# Patient Record
Sex: Female | Born: 1960
Health system: Southern US, Community
[De-identification: ages and names within clinical notes are randomized; demographics above are authoritative.]

## PROBLEM LIST (undated history)

## (undated) DIAGNOSIS — I10 Essential (primary) hypertension: Secondary | ICD-10-CM

## (undated) DIAGNOSIS — E871 Hypo-osmolality and hyponatremia: Secondary | ICD-10-CM

## (undated) DIAGNOSIS — Z803 Family history of malignant neoplasm of breast: Secondary | ICD-10-CM

## (undated) HISTORY — DX: Hypo-osmolality and hyponatremia: E87.1

## (undated) HISTORY — DX: Essential (primary) hypertension: I10

## (undated) HISTORY — DX: Family history of malignant neoplasm of breast: Z80.3

---

## 2006-12-10 ENCOUNTER — Other Ambulatory Visit: Admission: RE | Admit: 2006-12-10 | Discharge: 2006-12-10 | Payer: Self-pay | Admitting: Obstetrics and Gynecology

## 2007-01-16 ENCOUNTER — Encounter: Admission: RE | Admit: 2007-01-16 | Discharge: 2007-01-16 | Payer: Self-pay | Admitting: Obstetrics and Gynecology

## 2007-12-19 ENCOUNTER — Other Ambulatory Visit: Admission: RE | Admit: 2007-12-19 | Discharge: 2007-12-19 | Payer: Self-pay | Admitting: Obstetrics and Gynecology

## 2008-03-08 ENCOUNTER — Encounter: Admission: RE | Admit: 2008-03-08 | Discharge: 2008-03-08 | Payer: Self-pay | Admitting: Family Medicine

## 2009-04-06 ENCOUNTER — Encounter: Admission: RE | Admit: 2009-04-06 | Discharge: 2009-04-06 | Payer: Self-pay | Admitting: Obstetrics and Gynecology

## 2009-04-13 ENCOUNTER — Encounter: Admission: RE | Admit: 2009-04-13 | Discharge: 2009-04-13 | Payer: Self-pay | Admitting: Obstetrics and Gynecology

## 2009-05-16 ENCOUNTER — Other Ambulatory Visit: Admission: RE | Admit: 2009-05-16 | Discharge: 2009-05-16 | Payer: Self-pay | Admitting: Obstetrics and Gynecology

## 2010-05-03 ENCOUNTER — Encounter: Admission: RE | Admit: 2010-05-03 | Discharge: 2010-05-03 | Payer: Self-pay | Admitting: Obstetrics and Gynecology

## 2010-11-27 ENCOUNTER — Encounter: Payer: Self-pay | Admitting: Obstetrics and Gynecology

## 2011-04-09 ENCOUNTER — Other Ambulatory Visit: Payer: Self-pay | Admitting: Obstetrics and Gynecology

## 2011-04-09 DIAGNOSIS — Z1231 Encounter for screening mammogram for malignant neoplasm of breast: Secondary | ICD-10-CM

## 2011-05-23 ENCOUNTER — Ambulatory Visit
Admission: RE | Admit: 2011-05-23 | Discharge: 2011-05-23 | Disposition: A | Discharge status: Home / Self Care | Payer: 59 | Source: Ambulatory Visit | Attending: Obstetrics and Gynecology | Admitting: Obstetrics and Gynecology

## 2011-05-23 DIAGNOSIS — Z1231 Encounter for screening mammogram for malignant neoplasm of breast: Secondary | ICD-10-CM

## 2011-05-24 ENCOUNTER — Other Ambulatory Visit: Payer: Self-pay | Admitting: Obstetrics and Gynecology

## 2011-05-24 DIAGNOSIS — R928 Other abnormal and inconclusive findings on diagnostic imaging of breast: Secondary | ICD-10-CM

## 2011-06-01 ENCOUNTER — Other Ambulatory Visit: Payer: Self-pay | Admitting: Obstetrics and Gynecology

## 2011-06-01 ENCOUNTER — Ambulatory Visit
Admission: RE | Admit: 2011-06-01 | Discharge: 2011-06-01 | Disposition: A | Discharge status: Home / Self Care | Payer: 59 | Source: Ambulatory Visit | Attending: Obstetrics and Gynecology | Admitting: Obstetrics and Gynecology

## 2011-06-01 DIAGNOSIS — R928 Other abnormal and inconclusive findings on diagnostic imaging of breast: Secondary | ICD-10-CM

## 2011-06-06 ENCOUNTER — Ambulatory Visit
Admission: RE | Admit: 2011-06-06 | Discharge: 2011-06-06 | Disposition: A | Discharge status: Home / Self Care | Payer: 59 | Source: Ambulatory Visit | Attending: Obstetrics and Gynecology | Admitting: Obstetrics and Gynecology

## 2011-06-06 DIAGNOSIS — R928 Other abnormal and inconclusive findings on diagnostic imaging of breast: Secondary | ICD-10-CM

## 2011-06-18 ENCOUNTER — Ambulatory Visit (INDEPENDENT_AMBULATORY_CARE_PROVIDER_SITE_OTHER): Payer: 59 | Admitting: General Surgery

## 2011-06-18 ENCOUNTER — Encounter (INDEPENDENT_AMBULATORY_CARE_PROVIDER_SITE_OTHER): Payer: Self-pay | Admitting: General Surgery

## 2011-06-18 ENCOUNTER — Other Ambulatory Visit (INDEPENDENT_AMBULATORY_CARE_PROVIDER_SITE_OTHER): Payer: Self-pay | Admitting: General Surgery

## 2011-06-18 VITALS — BP 160/96 | HR 64 | Temp 98.8°F | Ht 60.0 in | Wt 143.0 lb

## 2011-06-18 DIAGNOSIS — R921 Mammographic calcification found on diagnostic imaging of breast: Secondary | ICD-10-CM

## 2011-06-18 DIAGNOSIS — R920 Mammographic microcalcification found on diagnostic imaging of breast: Secondary | ICD-10-CM

## 2011-06-18 NOTE — Progress Notes (Signed)
Subjective:     Patient ID: [name], female   DOB: [birth year], 50 y.o.   MRN: [id number]  HPI This is a very pleasant 50 year old Caucasian female, sent to me through the courtesy of Dr. Anselmo Pickler had a resident drains were. She is here for consideration of excision of an area of microcalcifications in the upper-outer quadrant of the left breast which could not be done stereotactically. Her primary care physician is Gerald Leitz. She also was seen by [facility] cardiology because of hypertension.  The patient has never had a breast problem in the past. She simply went for screening mammograms. They found a focal area of microcalcifications  high in the upper outer quadrant of the left breast, very superior and very posterior. They attempted stereotactic biopsy but could not image this well for that. She was referred for consideration of open biopsy.  Family history is strongly positive for breast cancer in the mother who is deceased, and a sister who is still alive. It is not known for sure whether any genetic testing has been done.  Past Medical History  Diagnosis Date  . Hypertension   . Family history of breast cancer     mother, sister   Current Outpatient Prescriptions  Medication Sig Dispense Refill  . hydrochlorothiazide 25 MG tablet Take 25 mg by mouth daily.        . ramipril (ALTACE) 10 MG tablet Take 10 mg by mouth daily.         Not on File  Family History  Problem Relation Age of Onset  . Cancer Mother     breast  . Heart disease Father   . Cancer Sister     breast   History  Substance Use Topics  . Smoking status: Current Everyday Smoker -- 0.3 packs/day    Types: Cigarettes  . Smokeless tobacco: Not on file  . Alcohol Use: Yes     Weekly     Review of Systems  Constitutional: Negative.   HENT: Negative.   Eyes: Negative.   Respiratory: Negative.   Cardiovascular: Negative.   Gastrointestinal: Negative.   Genitourinary: Negative.   Musculoskeletal:  Negative.   Neurological: Negative.   Hematological: Negative.   Psychiatric/Behavioral: Negative.        Objective:   Physical Exam  Constitutional: She is oriented to person, place, and time. She appears well-developed and well-nourished. No distress.  HENT:  Head: Normocephalic and atraumatic.  Mouth/Throat: No oropharyngeal exudate.  Eyes: Conjunctivae and EOM are normal. Pupils are equal, round, and reactive to light. No scleral icterus.  Neck: Neck supple. No JVD present. No tracheal deviation present. No thyromegaly present.  Cardiovascular: Normal rate and normal heart sounds.   No murmur heard.      occas. Extrasystole.  Pulmonary/Chest: Effort normal and breath sounds normal. No respiratory distress. She has no wheezes. She has no rales. She exhibits no tenderness.    Abdominal: Soft. Bowel sounds are normal. She exhibits no distension and no mass. There is no tenderness. There is no rebound and no guarding.  Musculoskeletal: Normal range of motion. She exhibits no edema and no tenderness.  Lymphadenopathy:    She has no cervical adenopathy.  Neurological: She is alert and oriented to person, place, and time. She exhibits normal muscle tone.  Skin: Skin is warm and dry. No rash noted. No erythema. No pallor.  Psychiatric: She has a normal mood and affect. Her behavior is normal. Judgment and thought content normal.  Assessment:     Focal microcalcifications, left breast, upper outer quadrant, posterior.  Strong family history of breast cancer in the mother and sister.  Hypertension.    Plan:     Scheduled for excision left breast microcalcifications with needle localization in the near future.  I have discussed the indications and details of surgery with her. Risks and complications have been outlined, including but not limited to bleeding, infection, reoperation for positive margins of cancer, reoperation for complications, cosmetic deformity, cardiac  pulmonary and thromboembolic problems. She seems to understand these issues well. This time all of her questions were answered. She is infiltrated with this plan.  I told her that consideration could be given to referring her to a high risk clinic at some point in the future.

## 2011-06-18 NOTE — Patient Instructions (Signed)
You have a focal area of microcalcifications in the very posterior aspect of the upper outer quadrant of your left breast. You also have a strong family history of breast cancer in her mother and sister. These findings are high risk findings, although this may not be cancer. I cannot feel anything abnormal in your breast. We plan to schedule you for a left breast biopsy with needle localization in the near future.

## 2011-06-28 ENCOUNTER — Encounter (HOSPITAL_BASED_OUTPATIENT_CLINIC_OR_DEPARTMENT_OTHER)
Admission: RE | Admit: 2011-06-28 | Discharge: 2011-06-28 | Disposition: A | Discharge status: Home / Self Care | Payer: 59 | Source: Ambulatory Visit | Attending: General Surgery | Admitting: General Surgery

## 2011-06-28 LAB — BASIC METABOLIC PANEL
CO2: 29 mEq/L (ref 19–32)
Calcium: 10.5 mg/dL (ref 8.4–10.5)
Creatinine, Ser: 0.61 mg/dL (ref 0.50–1.10)
GFR calc Af Amer: >60 mL/min (ref >60)
GFR calc non Af Amer: >60 mL/min (ref >60)
Potassium: 3.9 mEq/L (ref 3.5–5.1)
Sodium: 133 mEq/L — ABNORMAL LOW (ref 135–145)

## 2011-06-28 LAB — DIFFERENTIAL
Lymphocytes Relative: 27 % (ref 12–46)
Monocytes Absolute: 0.9 10*3/uL (ref 0.1–1.0)
Monocytes Relative: 9 % (ref 3–12)
Neutrophils Relative %: 61 % (ref 43–77)

## 2011-06-28 LAB — URINALYSIS, ROUTINE W REFLEX MICROSCOPIC
Hgb urine dipstick: NEGATIVE
Ketones, ur: 15 mg/dL — AB
Nitrite: NEGATIVE
pH: 7 (ref 5.0–8.0)

## 2011-06-28 LAB — CBC
MCV: 88.4 fL (ref 78.0–100.0)
RBC: 5.19 MIL/uL — ABNORMAL HIGH (ref 3.87–5.11)
RDW: 13.2 % (ref 11.5–15.5)

## 2011-06-28 LAB — URINE MICROSCOPIC-ADD ON

## 2011-06-29 ENCOUNTER — Ambulatory Visit
Admission: RE | Admit: 2011-06-29 | Discharge: 2011-06-29 | Disposition: A | Discharge status: Home / Self Care | Payer: 59 | Source: Ambulatory Visit | Attending: General Surgery | Admitting: General Surgery

## 2011-06-29 ENCOUNTER — Other Ambulatory Visit (INDEPENDENT_AMBULATORY_CARE_PROVIDER_SITE_OTHER): Payer: Self-pay | Admitting: General Surgery

## 2011-06-29 ENCOUNTER — Ambulatory Visit (HOSPITAL_BASED_OUTPATIENT_CLINIC_OR_DEPARTMENT_OTHER)
Admission: RE | Admit: 2011-06-29 | Discharge: 2011-06-29 | Disposition: A | Discharge status: Home / Self Care | Payer: 59 | Source: Ambulatory Visit | Attending: General Surgery | Admitting: General Surgery

## 2011-06-29 DIAGNOSIS — Z01810 Encounter for preprocedural cardiovascular examination: Secondary | ICD-10-CM | POA: Insufficient documentation

## 2011-06-29 DIAGNOSIS — R921 Mammographic calcification found on diagnostic imaging of breast: Secondary | ICD-10-CM

## 2011-06-29 DIAGNOSIS — Z803 Family history of malignant neoplasm of breast: Secondary | ICD-10-CM | POA: Insufficient documentation

## 2011-06-29 DIAGNOSIS — R920 Mammographic microcalcification found on diagnostic imaging of breast: Secondary | ICD-10-CM

## 2011-06-29 DIAGNOSIS — F17200 Nicotine dependence, unspecified, uncomplicated: Secondary | ICD-10-CM | POA: Insufficient documentation

## 2011-06-29 DIAGNOSIS — Z01812 Encounter for preprocedural laboratory examination: Secondary | ICD-10-CM | POA: Insufficient documentation

## 2011-06-29 DIAGNOSIS — I10 Essential (primary) hypertension: Secondary | ICD-10-CM | POA: Insufficient documentation

## 2011-06-29 DIAGNOSIS — Z01818 Encounter for other preprocedural examination: Secondary | ICD-10-CM | POA: Insufficient documentation

## 2011-06-29 HISTORY — PX: BREAST BIOPSY: SHX20

## 2011-07-02 NOTE — Op Note (Signed)
NAMESTEPHENE, [name] NO.:  [id number]  MEDICAL RECORD NO.:  [id number]  LOCATION:                                 FACILITY:  PHYSICIAN:  Angelia Mould. Derrell Lolling, M.D.     DATE OF BIRTH:  DATE OF PROCEDURE:  06/29/2011 DATE OF DISCHARGE:                              OPERATIVE REPORT   PREOPERATIVE DIAGNOSIS:  Microcalcifications, left breast, family history of breast cancer.  POSTOPERATIVE DIAGNOSIS:  Microcalcifications, left breast, family history of breast cancer.  OPERATION PERFORMED:  Left partial mastectomy with needle localization.  SURGEON:  Angelia Mould. Derrell Lolling, MD  OPERATIVE INDICATIONS:  This is a 50 year old Caucasian female who recently had screening mammograms and there was a very focal area of microcalcifications, very high and very posterior in the upper outer quadrant of the left breast.  Stereotactic biopsy was not technically feasible and the patient was referred for consideration of an open biopsy.  Physical exam is unremarkable.  Family history is strongly positive for breast cancer in the mother and in a sister.  She is brought to the operating room electively.  OPERATIVE TECHNIQUE:  The patient underwent wire localization at [facility] prior to the surgery.  The wire entered from the very high upper outer quadrant of the breast and was directed inferomedially and posteriorly.  The wire did go very close to the microcalcifications and passed it for about 2.5 cm.  The patient was then brought to [facility].  The patient was identified and the films were reviewed.  She was taken to operating room.  General anesthesia was induced.  The left breast was prepped and draped in a sterile fashion.  The patient was identified as correct patient, correct procedure, and correct site.  Intravenous antibiotics were given.  0.5% Marcaine with epinephrine was used a local infiltration anesthetic.  A curvilinear  incision, which paralleled the areolar margin was placed very high in the upper outer quadrant just inside the wire insertion site.  Dissection was carried down into the breast tissue around the wire.  The dissection was carried all the way down to the pectoralis fascia and down and around at the lateral border of the pectoralis major muscle and almost entering the axilla.  I took a long cylindrical piece of breast tissue out and got the entire specimen and wire out in one-piece.  Specimen mammogram showed the calcifications to be in the specimen adjacent to the wire.  Dr. Roswell Nickel reviewed this. The specimen was marked with a six-color margin marker kit.  The specimen was sent to Pathology.  The wound was irrigated with saline. Hemostasis was excellent and achieved with electrocautery.  The deeper breast tissues were closed with interrupted sutures of 3-0 Vicryl and the skin closed with running subcuticular suture of 4-0 Monocryl and Dermabond.  The patient was taken to the recovery room in good condition.  Estimated blood loss was about 10 mL.  Complications were none.  Sponge, needle, and instrument counts were correct.     Angelia Mould. Derrell Lolling, M.D.     HMI/MEDQ  D:  06/29/2011  T:  06/30/2011  Job:  [id number]  cc:  Anselmo Pickler, MD Gerald Leitz, MD Corky Crafts, MD  Electronically Signed by Claud Kelp M.D. on 07/02/2011 07:02:03 PM

## 2011-07-03 ENCOUNTER — Telehealth (INDEPENDENT_AMBULATORY_CARE_PROVIDER_SITE_OTHER): Payer: Self-pay

## 2011-07-03 NOTE — Telephone Encounter (Signed)
Per Dr Jacinto Halim request pt advised of path result. No cancer. No further surgery. Pt at slight higher risk for br ca in future and Dr Derrell Lolling will discuss this with pt at 9-13 ov.

## 2011-07-16 ENCOUNTER — Encounter (INDEPENDENT_AMBULATORY_CARE_PROVIDER_SITE_OTHER): Payer: Self-pay | Admitting: General Surgery

## 2011-07-16 ENCOUNTER — Ambulatory Visit (INDEPENDENT_AMBULATORY_CARE_PROVIDER_SITE_OTHER): Payer: 59 | Admitting: General Surgery

## 2011-07-16 VITALS — BP 148/88 | HR 76

## 2011-07-16 DIAGNOSIS — N6099 Unspecified benign mammary dysplasia of unspecified breast: Secondary | ICD-10-CM

## 2011-07-16 DIAGNOSIS — N6089 Other benign mammary dysplasias of unspecified breast: Secondary | ICD-10-CM

## 2011-07-16 NOTE — Patient Instructions (Signed)
You are healing uneventfully from your left breast biopsy. The final pathology report shows atypical ductal hyperplasia. Because of this pathology, and because you have two  first relatives with breast cancer, you are at  increased risk for developing breast cancer in the future. No further surgery is to be done at this time. I advise annual mammography and annual breast exam in the future. You will be referred to Dr. Drue Second at the high risk risk clinic for assessment and possible genetic testing. Return to see me if any further problems arise.

## 2011-07-16 NOTE — Progress Notes (Signed)
Subjective:     Patient ID: [name], female   DOB: [birth year], 50 y.o.   MRN: [id number]  HPI  This patient underwent left breast biopsy with needle localization recently. The final pathology report shows atypical ductal hyperplasia. She has no problems with healing to date. I have discussed her pathology report with her, and I have given her a copy of the report.  We also reviewed the fact that both her mother and sister had breast cancer. The patient was advised that this places her at increased risk for breast cancer in the future. I discussed the role of referral to a high risk clinic, and she is in favor of that. She is aware that they may offer her genetic testing. Review of Systems     Objective:   Physical Exam The patient is in good spirits and looks fine.  Left breast incision, upper outer quadrant, is healing nicely. There is no sign of infection or hematoma. There is no drainage.    Assessment:     Atypical ductal hyperplasia, left breast, upper outer quadrant, status post excision.  Positive family history for breast cancer in mother and sister.    Plan:        The patient is advised of her high risk status.  She is advised to get annual mammography and annual breast exam.  She'll be referred to Dr. Drue Second at the [facility]  breast high risk clinic.  Return to see me p.r.n.

## 2011-09-20 ENCOUNTER — Encounter: Payer: 59 | Admitting: Oncology

## 2011-09-20 ENCOUNTER — Ambulatory Visit: Payer: 59

## 2011-11-10 ENCOUNTER — Telehealth: Payer: Self-pay | Admitting: Family

## 2011-11-10 NOTE — Telephone Encounter (Signed)
called pt lmovm to rtn call to schedule

## 2011-11-15 ENCOUNTER — Telehealth: Payer: Self-pay | Admitting: Oncology

## 2011-11-15 ENCOUNTER — Telehealth: Payer: Self-pay | Admitting: Family

## 2011-11-15 NOTE — Telephone Encounter (Signed)
called pts lmov m to rtn call to scheduled appt 

## 2011-12-13 ENCOUNTER — Telehealth: Payer: Self-pay | Admitting: *Deleted

## 2011-12-13 NOTE — Telephone Encounter (Signed)
Confirmed 12/26/11 high risk appt w/ pt.

## 2011-12-20 ENCOUNTER — Other Ambulatory Visit: Payer: Self-pay | Admitting: Physician Assistant

## 2011-12-26 ENCOUNTER — Ambulatory Visit (HOSPITAL_BASED_OUTPATIENT_CLINIC_OR_DEPARTMENT_OTHER): Payer: 59 | Admitting: Family

## 2011-12-26 ENCOUNTER — Telehealth: Payer: Self-pay | Admitting: Oncology

## 2011-12-26 ENCOUNTER — Encounter: Payer: Self-pay | Admitting: Family

## 2011-12-26 DIAGNOSIS — N6099 Unspecified benign mammary dysplasia of unspecified breast: Secondary | ICD-10-CM | POA: Insufficient documentation

## 2011-12-26 DIAGNOSIS — I10 Essential (primary) hypertension: Secondary | ICD-10-CM

## 2011-12-26 DIAGNOSIS — N6089 Other benign mammary dysplasias of unspecified breast: Secondary | ICD-10-CM

## 2011-12-26 DIAGNOSIS — Z803 Family history of malignant neoplasm of breast: Secondary | ICD-10-CM

## 2011-12-26 NOTE — Progress Notes (Signed)
[facility]  High Risk Clinic New Patient Evaluation  Name: [name]            Date: 12/26/2011 MRN: [id number]                DOB: 10-18-1961  CC: Jessee Avers., MD, MD     REFERRING PHYSICIAN: Jessee Avers., MD   REASON FOR VISIT: Recent diagnosis of atypical ductal hyperplasia, left breast.   HISTORY OF PRESENT ILLNESS: Had screening mammogram July 2012 which showed suspicious lesion, left breast. Had excisional biopsy by Dr. Derrell Lolling August 2012, showed atypical ductal hyperplasia with radial scar.   PREVIOUS BREAST/OVARIAN BIOPSIES: One, September 2012. (See above).   PAST MEDICAL HISTORY:  Hypertension, otherwise healthy. No prior thoracic radiation.   PAST SURGICAL HISTORY:  Past Surgical History  Procedure Date  . Breast biopsy 06/29/11    left      CURRENT MEDICATIONS: Altace and Norvasc daily.  ALLERGIES: No known allergies.  SOCIAL HISTORY: Former smoker, drinks wine, 1 glass nightly.   HEALTH HABITS: Vitamins: No Supplements:No Alternative Therapies: No Adverse environmental exposure: No Servings of fruit and vegetables/day: 4 Servings of meat/day: 1 Exercises regularly:  45  Min/wk:  REPRODUCTIVE HISTORY:  Menarche age: 51 Gravida:   3    Para:  3 First Live Birth:  Age 51 Took fertility meds:   no  Menses: 12 Menopause: natural/surgical  Age 51 HRT Y/N Currently Y/N   FAMILY HISTORY: Breast cancer, mother in her 3's. Was treated with chemotherapy and died of Leukemia 10 years later. Sister with breast cancer in her 75's, still living. Sister was tested for BRCA1/2 in 2003: negative. Maternal aunt with ovarian cancer. No female breast cancer.    HEALTH MAINTENANCE: Last mammogram: July 2012 Last clinical breast exam: August 2012 Performs self breast exam: yes Last Pap Smear: Not sure.  BMI: 28.28  REVIEW OF SYSTEMS:  General: Negative for fever, chills, night sweats,  loss of appetite or weight loss. HEENT:  Negative for headaches, sore  throat, difficulty swallowing, blurred vision or problem with hearing or  sinus congestion. Respiratory: Negative for shortness of breath, cough  or dyspnea on exertion. Cardiovascular: Negative for chest pain,  palpitations or pedal edema. GI: Negative for nausea, vomiting,  diarrhea, constipation, change in bowel habits or blood in the stool.  No jaundice. GU: Negative for painful or frequent urination, change in  color of urine, or decreased urinary stream. Integumentary: Negative  for skin rashes or other suspicious skin lesions. Hematologic: Negative  for easy bruisability or bleeding. Musculoskeletal: Negative for  complaints of pain, arthralgias, arthritis or myalgias.  Neurological/psychiatric: Negative for numbness, focal weakness,  balance problems or coordination difficulties. No depression or mood swings.  Breast: No self detected abnormalities in the breast. No nipple discharge, masses or redness of the skin.   PHYSICAL EXAM: BP 152/90  Pulse 84  Temp(Src) 98.4 F (36.9 C) (Oral)  Ht 5' (1.524 m)  Wt 144 lb 12.8 oz (65.681 kg)  BMI 28.28 kg/m2 GENERAL: Well developed, well nourished, in no acute distress.  EENT: No ocular or oral lesions. No stomatitis.  RESPIRATORY: Lungs are clear to auscultation bilaterally with normal respiratory movement and no accessory muscle use. CARDIAC: No murmur, rub or tachycardia. No upper or lower extremity edema.  GI: Abdomen is soft, no palpable hepatosplenomegaly. No fluid wave. No tenderness. Musculoskeletal: No kyphosis, no tenderness over the spine, ribs or hips. Lymph: No cervical, infraclavicular, axillary or  inguinal adenopathy. Neuro: No focal neurological deficits. Psych: Alert and oriented X 3, appropriate mood and affect.   ASSESSMENT: 51 year old with: 1. Strong family history of breast cancer 2. Biopsy showing atypical ductal hyperplasia, left breast. 3. GAIL model 6.5% 5 year risk, 42.4%  Lifetime risk of breast cancer.    PLAN:  1. Referral to [facility] for genetic testing.  2. Information is given on Tamoxifen and Raloxifene. She will return in 1 month, we should have results of genetic testing at that visit and recommendations will be made based on those results.   A discussion is held about Tamoxifen/Raloxifene and the benefits vs risks of both. Current NCCN guidelines for high risk patients are reviewed. The discussion is tabled until such time we have genetic testing results, as a BRCA mutation would drastically change our medical management.   A total of 50 minutes was spent with the patient in counseling.

## 2011-12-26 NOTE — Telephone Encounter (Signed)
gve the pt her march 2013 appt. Pt is aware she will be contacted with the appt for the genetics. gve information to [employer]

## 2012-01-01 ENCOUNTER — Telehealth: Payer: Self-pay | Admitting: *Deleted

## 2012-01-01 NOTE — Telephone Encounter (Signed)
Confirmed 01/07/12 genetics appt w/ pt.

## 2012-01-07 ENCOUNTER — Ambulatory Visit (HOSPITAL_BASED_OUTPATIENT_CLINIC_OR_DEPARTMENT_OTHER): Payer: 59 | Admitting: Genetic Counselor

## 2012-01-07 ENCOUNTER — Ambulatory Visit: Payer: 59 | Admitting: Lab

## 2012-01-07 DIAGNOSIS — N6089 Other benign mammary dysplasias of unspecified breast: Secondary | ICD-10-CM

## 2012-01-07 DIAGNOSIS — Z803 Family history of malignant neoplasm of breast: Secondary | ICD-10-CM

## 2012-01-07 DIAGNOSIS — Z8041 Family history of malignant neoplasm of ovary: Secondary | ICD-10-CM

## 2012-01-07 DIAGNOSIS — N6099 Unspecified benign mammary dysplasia of unspecified breast: Secondary | ICD-10-CM

## 2012-01-07 NOTE — Progress Notes (Signed)
Dr.  Welton Flakes requested a consultation for genetic counseling and risk assessment for [name], a 51 y.o. female, for discussion of her personal history of atypical hyperplasia and family history of breast and ovarian cancer. She presents to clinic today to discuss the possibility of a genetic predisposition to cancer, and to further clarify her risks, as well as her family members' risks for cancer.   HISTORY OF PRESENT ILLNESS: [name] is a 51 y.o. female with no personal history of cancer.    Past Medical History  Diagnosis Date  . Hypertension   . Family history of breast cancer     mother, sister    Past Surgical History  Procedure Date  . Breast biopsy 06/29/11    left, atypical ductal hyperplasia with radial scar    History  Substance Use Topics  . Smoking status: Current Some Day Smoker    Types: Cigarettes  . Smokeless tobacco: Not on file  . Alcohol Use: 3.0 oz/week    5 Glasses of wine per week     Weekly    REPRODUCTIVE HISTORY AND PERSONAL RISK ASSESSMENT FACTORS: Menarche was at age 89.   Postmenopausal: 1-2 years Uterus Intact: Yes Ovaries Intact: Yes G3P3A0 , first live birth at age 52  She has not previously undergone treatment for infertility.    FAMILY HISTORY:  We obtained a detailed, 4-generation family history.  Significant diagnoses are listed below: Family History  Problem Relation Age of Onset  . Cancer Mother     breast  . Heart disease Father   . Cancer Sister     breast  . Cancer Maternal Aunt   Ms. Loree has a personal history of atypical hyperplasia.  She has two sisters, one whom was diagnosed with breast cancer at age 78.  This sister underwent BRCA testing in 2003 and was negative.l  Ms. Hainline's mother was diagnosed with breast cancer in her 75s and died of leukemia in her 43s.  She has a maternal aunt with ovarian cancer diagnosed over the age of 59.  There are several other cancers in her mother's siblings, but Ms.  Mezera could not be specific.  She will find out more information and call me.  Her father died of a heart attack at age 47.  He had 3 siblings, however no information is known about their health status.  His parents died when he was very young and no information is known on them.  Patient's ancestors are of Irish/German descent. There is no reported Ashkenazi Jewish ancestry. There is no  known consanguinity.  GENETIC COUNSELING RISK ASSESSMENT, DISCUSSION, AND SUGGESTED FOLLOW UP: We reviewed the natural history and genetic etiology of sporadic, familial and hereditary cancer syndromes.  Based on the personal and family history of Ms. Schake, it is most suggestive of hereditary breast and ovarian cancer due to mutations in the BRCA1 and BRCA2 gene.  Even though her sister was negative in 2003, testing has changed and therefore it is suggested that she be tested.  We discussed that depending on the type of cancers in her mother's other siblings, we could consider either a Breast Next panel (panel of 14 genes implicated in breast cancer) or a cancer next panel (22 genes implicated in several cancer syndromes).  The patient's family history is suggestive of the following possible diagnosis: hereditary breast and ovarian cancer (BRCA1 and BRCA2)  We discussed that identification of a hereditary cancer syndrome may help her care providers tailor the patients  medical management. If a mutation indicating a BRCA1 or BRCA2 mutation is detected in this case, the [employer] recommendations would include increased screening for breast cancer and consideration of removal of her ovaries. If a mutation is detected, the patient will be referred back to the referring provider and to any additional appropriate care providers to discuss the relevant options.   If a mutation is not found in the patient, this will decrease the likelihood of BRCA1 and BRCA2 as the explanation for her family  history.  At that point we could consider pursuing a panel test through [facility].. Cancer surveillance options would be discussed for the patient according to the appropriate standard National Comprehensive Cancer Network and American Cancer Society guidelines, with consideration of their personal and family history risk factors. In this case, the patient will be referred back to their care providers for discussions of management.   After considering the risks, benefits, and limitations, the patient provided informed consent for  the following  testing:  BRACanalysis through [facility].   Per the patient's request, we will contact her by telephone to discuss these results. A follow up genetic counseling visit will be scheduled if indicated.  The patient was seen for a total of 40 minutes, greater than 50% of which was spent face-to-face counseling.  This plan is being carried out per Dr. Welton Flakes recommendations.  This note will also be sent to the referring provider via the electronic medical record. The patient will be supplied with a summary of this genetic counseling discussion as well as educational information on the discussed hereditary cancer syndromes following the conclusion of their visit.   Patient was discussed with Dr. Drue Second.   EDUCATIONAL INFORMATION SUPPLIED TO PATIENT AT ENCOUNTER:  Hereditary Breast and Ovarian Cancer by [facility]     _______________________________________________________________________ For Office Staff:  Number of people involved in session: 2 Was an Intern/ student involved with case: not applicable }

## 2012-01-15 ENCOUNTER — Encounter: Payer: Self-pay | Admitting: Genetic Counselor

## 2012-01-15 ENCOUNTER — Telehealth: Payer: Self-pay | Admitting: Genetic Counselor

## 2012-01-15 NOTE — Telephone Encounter (Signed)
Revealed negative BRCA and BART testing.  Discussed considering one of the panel tests through [facility].  She will think about it.

## 2012-01-22 ENCOUNTER — Telehealth: Payer: Self-pay | Admitting: *Deleted

## 2012-01-22 NOTE — Telephone Encounter (Signed)
patient called in requesting the cancel her appointment for 01-23-2012 but she will call back when she wants to reschedule

## 2012-01-23 ENCOUNTER — Ambulatory Visit: Payer: 59 | Admitting: Family

## 2012-02-14 ENCOUNTER — Telehealth: Payer: Self-pay | Admitting: *Deleted

## 2012-02-14 NOTE — Telephone Encounter (Signed)
Patient called me back and does not want to come in right now for the high risk clinic because it is too costly with her insurance and she has a lot going on.   She will continue to do her research and call us back in maybe 6 months or so.

## 2012-03-25 ENCOUNTER — Other Ambulatory Visit (HOSPITAL_COMMUNITY): Payer: Self-pay | Admitting: Otolaryngology

## 2012-03-27 ENCOUNTER — Other Ambulatory Visit (HOSPITAL_COMMUNITY): Payer: 59

## 2012-03-27 ENCOUNTER — Ambulatory Visit (HOSPITAL_COMMUNITY): Admission: RE | Admit: 2012-03-27 | Payer: 59 | Source: Ambulatory Visit

## 2012-04-01 ENCOUNTER — Other Ambulatory Visit (HOSPITAL_COMMUNITY): Payer: 59

## 2012-04-01 ENCOUNTER — Ambulatory Visit (HOSPITAL_COMMUNITY): Payer: 59

## 2012-04-04 ENCOUNTER — Ambulatory Visit (HOSPITAL_COMMUNITY)
Admission: RE | Admit: 2012-04-04 | Discharge: 2012-04-04 | Disposition: A | Discharge status: Home / Self Care | Payer: 59 | Source: Ambulatory Visit | Attending: Otolaryngology | Admitting: Otolaryngology

## 2012-04-04 ENCOUNTER — Ambulatory Visit (HOSPITAL_COMMUNITY): Admission: RE | Admit: 2012-04-04 | Payer: 59 | Source: Ambulatory Visit

## 2012-04-04 DIAGNOSIS — R1310 Dysphagia, unspecified: Secondary | ICD-10-CM | POA: Insufficient documentation

## 2012-04-04 NOTE — Progress Notes (Addendum)
Pt arrived for MBS to be followed by Barium Swallow. Pt complained of choking exclusively with solid foods with no history of pna. Pt expressed desire to limit cost of testing so SLP suggested doing Barium swallow first to determine if esophageal dysphagia was the primary cause of choking given complaints most consistent with this diagnosis. SLP observed Barium Swallow with Dr. Amil Amen who diagnosed pt with Schatzki's ring as likely source of complaint. SLP deferred MBS. If further testing is desired, please reorder. Thank you, Harlon Ditty, MA CCC-SLP [phone]

## 2012-04-07 ENCOUNTER — Encounter: Payer: Self-pay | Admitting: Internal Medicine

## 2012-04-30 ENCOUNTER — Ambulatory Visit: Payer: 59 | Admitting: Internal Medicine

## 2012-06-25 ENCOUNTER — Other Ambulatory Visit (HOSPITAL_COMMUNITY)
Admission: RE | Admit: 2012-06-25 | Discharge: 2012-06-25 | Disposition: A | Discharge status: Home / Self Care | Payer: 59 | Source: Ambulatory Visit | Attending: Obstetrics and Gynecology | Admitting: Obstetrics and Gynecology

## 2012-06-25 ENCOUNTER — Other Ambulatory Visit: Payer: Self-pay | Admitting: Obstetrics and Gynecology

## 2012-06-25 DIAGNOSIS — Z01419 Encounter for gynecological examination (general) (routine) without abnormal findings: Secondary | ICD-10-CM | POA: Insufficient documentation

## 2013-03-25 IMAGING — RF DG ESOPHAGUS
13 of 20 series · 14 of 24 positions shown · non-contrast
Comparison: None.

CLINICAL DATA: Dysphagia.  Vague feeling of food sticking in the
throat

ESOPHOGRAM/BARIUM SWALLOW
TECHNIQUE: Combined double contrast and single contrast
examination performed using effervescent crystals, thick barium
liquid, and thin barium liquid.
Fluoroscopy time:  2.3 minutes.

[Series 1: run · 1 of 8 slices shown (1 of 13)]
[im 1/8]
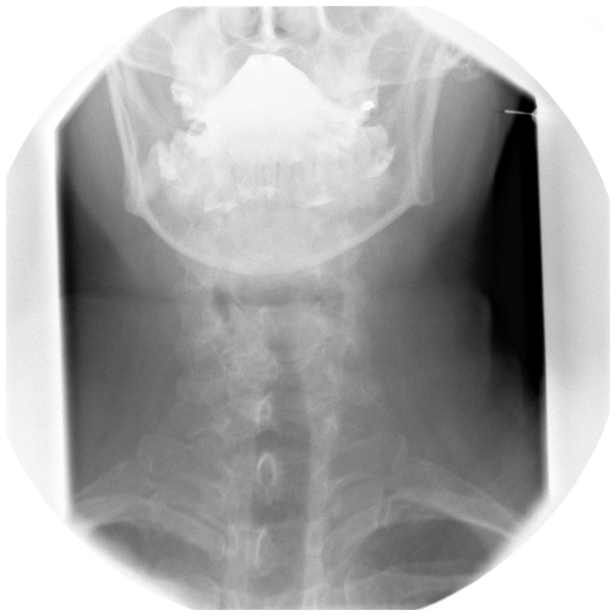

[Series 2: run · 2 of 10 slices shown (2 of 13)]
[im 1/10]
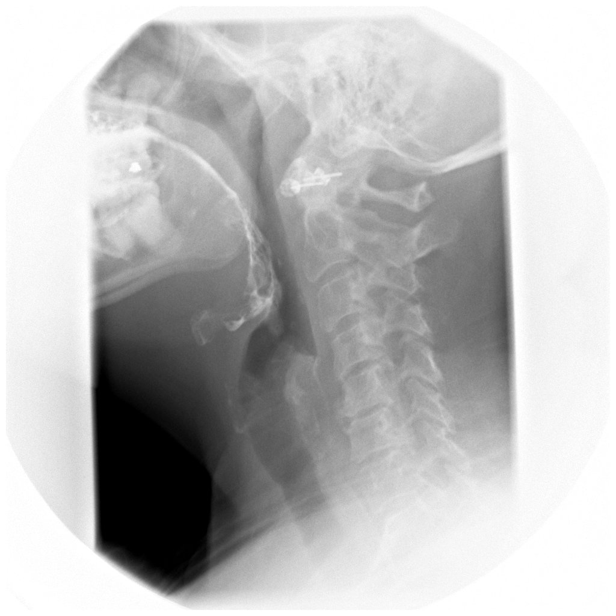
[im 7/10]
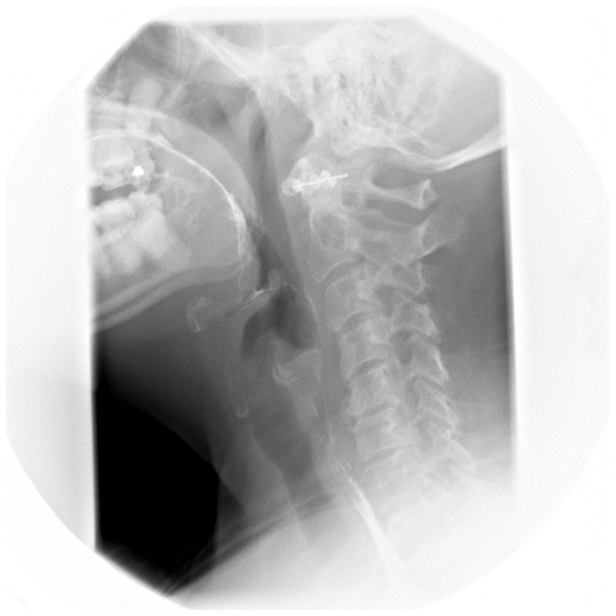

[Series 3: run · 1 of 1 slices shown (3 of 13)]
[im 1/1]
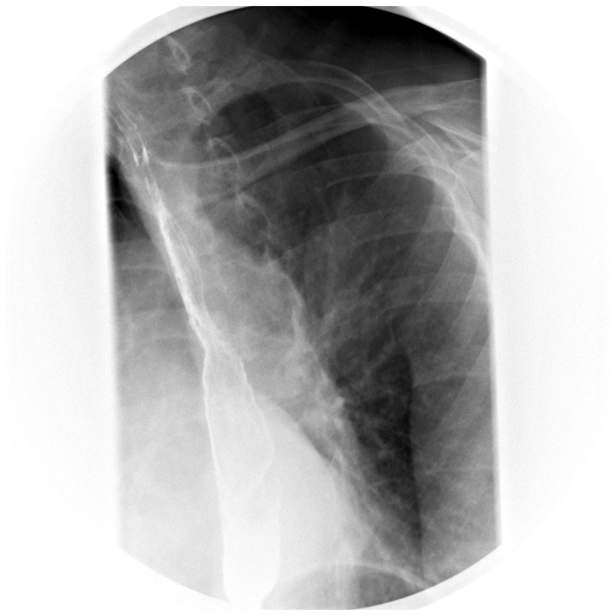

[Series 4: run · 1 of 1 slices shown (4 of 13)]
[im 1/1]
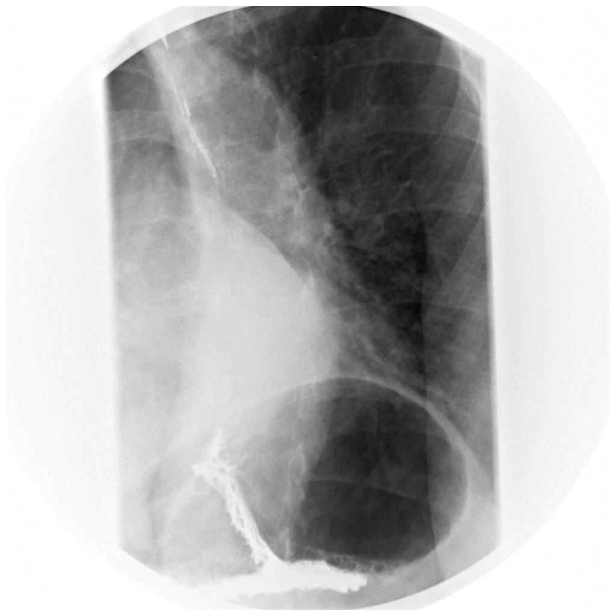

[Series 6: run · 1 of 1 slices shown (5 of 13)]
[im 1/1]
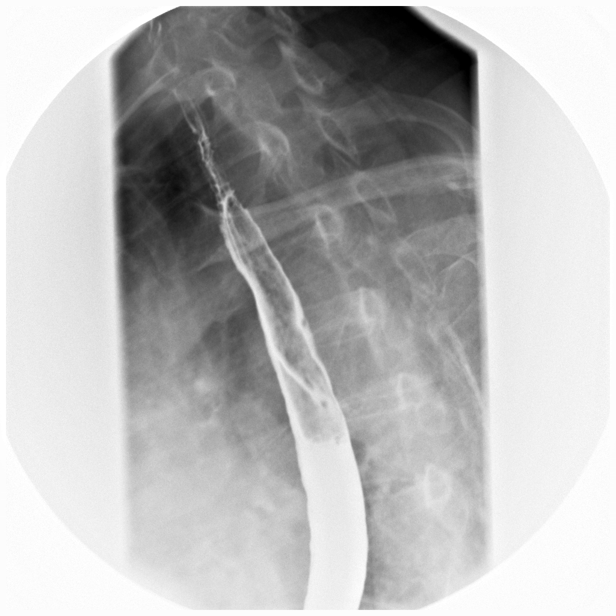

[Series 8: run · 1 of 1 slices shown (6 of 13)]
[im 1/1]
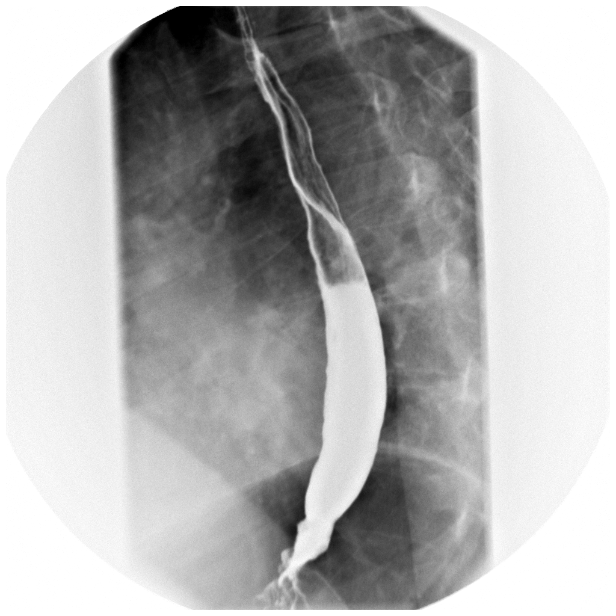

[Series 9: run · 1 of 1 slices shown (7 of 13)]
[im 1/1]
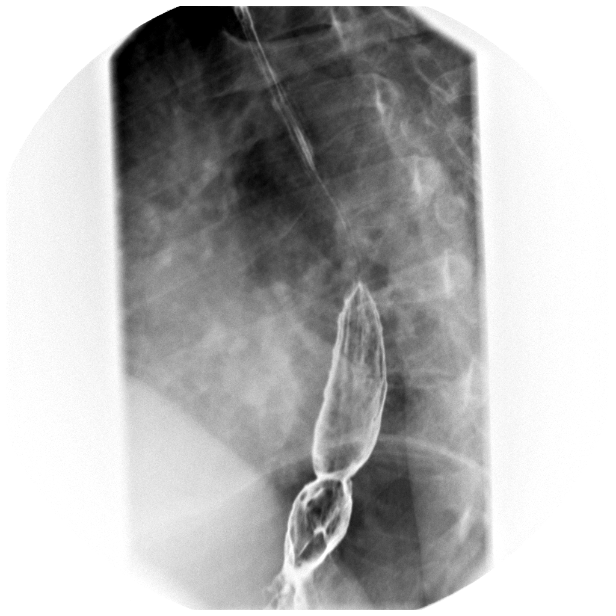

[Series 11: run · 1 of 1 slices shown (8 of 13)]
[im 1/1]
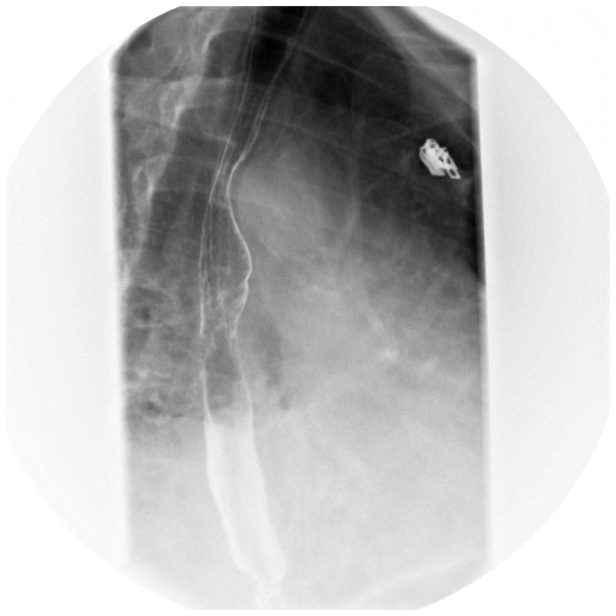

[Series 13: run · 1 of 1 slices shown (9 of 13)]
[im 1/1]
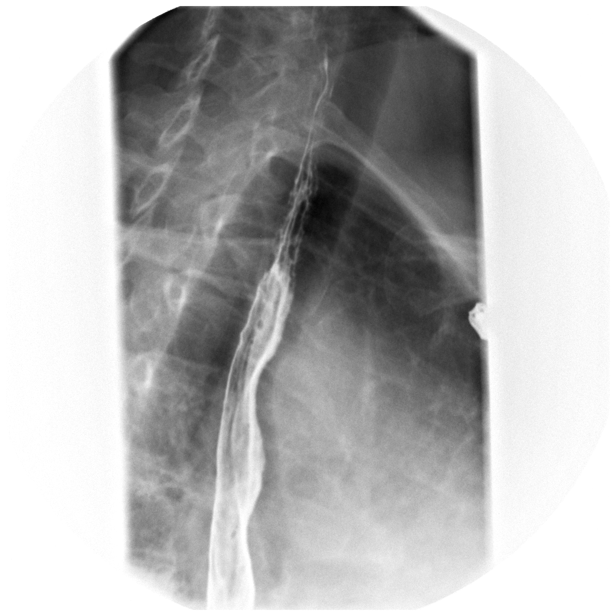

[Series 15: run · 1 of 1 slices shown (10 of 13)]
[im 1/1]
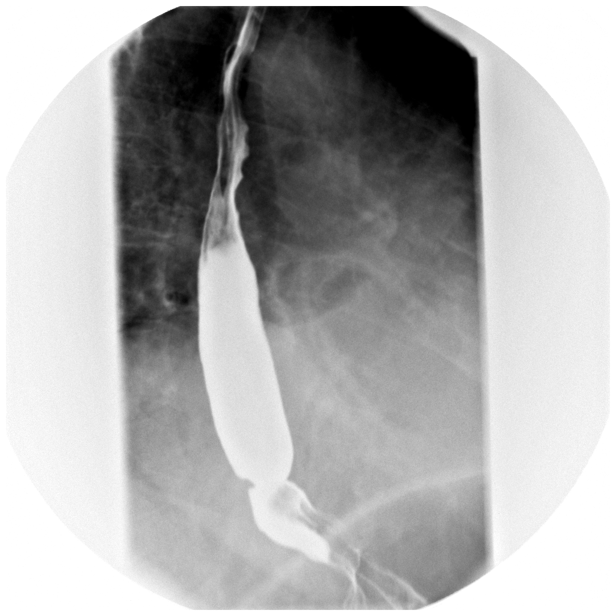

[Series 16: run · 1 of 1 slices shown (11 of 13)]
[im 1/1]
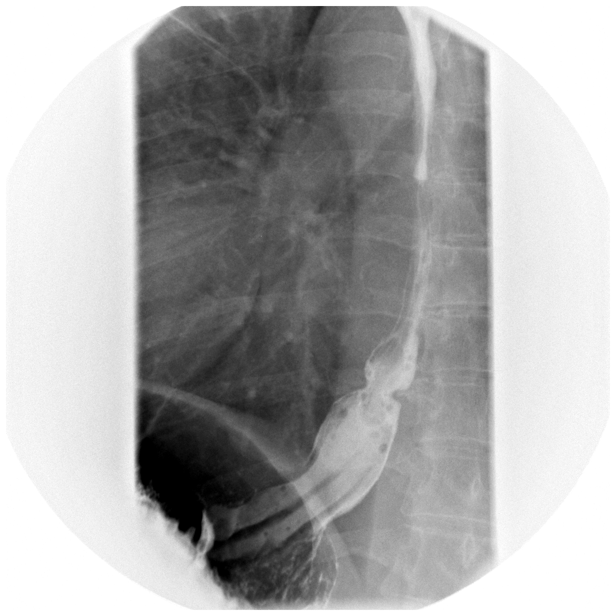

[Series 18: run · 1 of 1 slices shown (12 of 13)]
[im 1/1]
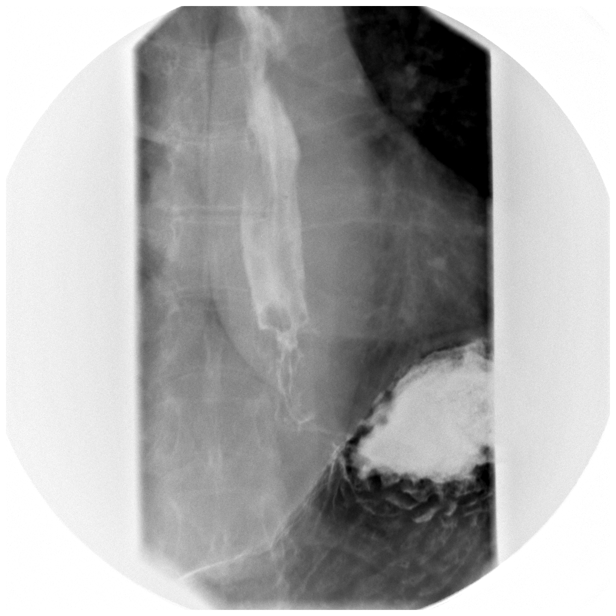

[Series 20: run · 1 of 1 slices shown (13 of 13)]
[im 1/1]
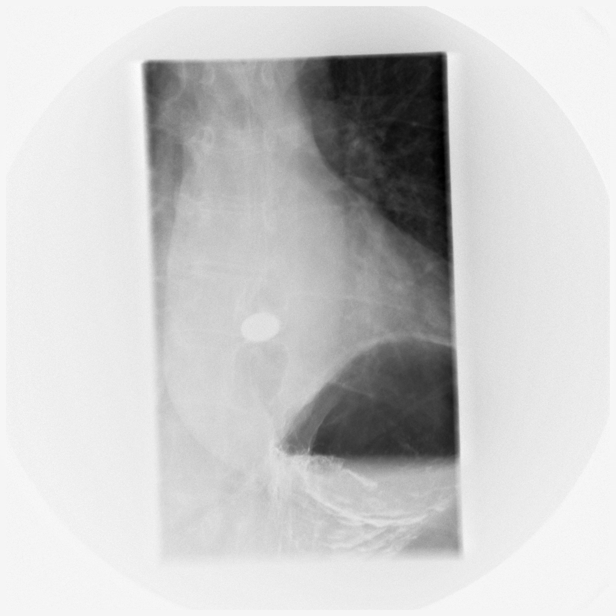

[14 of 24 positions shown; findings below may reference images not displayed]

FINDINGS: Rapid sequence imaging of the swallowing mechanism
demonstrate no abnormality in the posterior oropharynx or proximal
cervical esophagus.  There is mild indentation in the posterior
aspect of the esophagus from the spurring of the cervical spine at
C5-C6.  This does not encroach significantly upon the esophageal
lumen.

There is no mucosal irregularity within the thoracic esophagus or
distal esophagus.  Within the distal esophagus there is a
persistent circumferential stricturing above the vestibule of the
esophagus.  This stricturing restricts passage of a 13 mm barium
tablet.

No esophageal normal esophageal motility.  Mild gastroesophageal
reflux was demonstrated through the course the exam.  l
IMPRESSION: 1..  Persistent stricturing of the distal esophagus consistent with
Schatzki's ring.  This stricture does obstruct a 13 mm barium
tablet. Recommend gastroenterology consultation.

2.  No mucosal irregularity identified.
3.  Mild gastroesophageal reflux was demonstrated during coarse of
exam.
4.  No significant abnormality cervical esophagus.

## 2014-01-27 ENCOUNTER — Telehealth: Payer: Self-pay | Admitting: *Deleted

## 2014-01-27 MED ORDER — RAMIPRIL 10 MG PO CAPS: 10.0000 mg | ORAL_CAPSULE | Freq: Two times a day (BID) | ORAL | Status: DC

## 2014-01-27 NOTE — Telephone Encounter (Signed)
Refilled

## 2014-01-27 NOTE — Telephone Encounter (Signed)
Patient requests ramipril refill be [facility]. Thanks, MI

## 2014-01-29 ENCOUNTER — Encounter: Payer: Self-pay | Admitting: Interventional Cardiology

## 2014-03-16 ENCOUNTER — Ambulatory Visit: Payer: 59 | Admitting: Interventional Cardiology

## 2014-03-23 ENCOUNTER — Other Ambulatory Visit: Payer: Self-pay

## 2014-03-23 DIAGNOSIS — Z1231 Encounter for screening mammogram for malignant neoplasm of breast: Secondary | ICD-10-CM

## 2014-04-09 ENCOUNTER — Ambulatory Visit
Admission: RE | Admit: 2014-04-09 | Discharge: 2014-04-09 | Disposition: A | Discharge status: Home / Self Care | Payer: 59 | Source: Ambulatory Visit

## 2014-04-09 ENCOUNTER — Encounter (INDEPENDENT_AMBULATORY_CARE_PROVIDER_SITE_OTHER): Payer: Self-pay

## 2014-04-09 ENCOUNTER — Other Ambulatory Visit: Payer: Self-pay

## 2014-04-09 DIAGNOSIS — Z1231 Encounter for screening mammogram for malignant neoplasm of breast: Secondary | ICD-10-CM

## 2014-04-12 ENCOUNTER — Ambulatory Visit (INDEPENDENT_AMBULATORY_CARE_PROVIDER_SITE_OTHER): Payer: 59 | Admitting: Interventional Cardiology

## 2014-04-12 ENCOUNTER — Encounter: Payer: Self-pay | Admitting: Cardiology

## 2014-04-12 VITALS — BP 124/60 | HR 88 | Ht 60.0 in | Wt 134.0 lb

## 2014-04-12 DIAGNOSIS — I10 Essential (primary) hypertension: Secondary | ICD-10-CM

## 2014-04-12 DIAGNOSIS — R609 Edema, unspecified: Secondary | ICD-10-CM | POA: Insufficient documentation

## 2014-04-12 NOTE — Patient Instructions (Signed)
Your physician recommends that you continue on your current medications as directed. Please refer to the Current Medication list given to you today.  Your physician wants you to follow-up in: 1 year with Dr. Varanasi. You will receive a reminder letter in the mail two months in advance. If you don't receive a letter, please call our office to schedule the follow-up appointment.  

## 2014-04-12 NOTE — Progress Notes (Signed)
Patient ID: [name], female   DOB: 10-29-1961, 53 y.o.   MRN: [id number]    [address] [place], [place]  [address] Phone: [phone] Fax:  [phone]  Date:  04/12/2014   ID:  [name], DOB [birth year], MRN [id number]  PCP:  Kathleen Avers., [name]      History of Present Illness: [name] is a 53 y.o. female with HTN.  She has done well over the past year.   Feels well. Denies dizziness, palpitations, chest pain, SHOB, orthopnea, PND. Reports some mild swelling of BLE in the evening, but gets better by the morning. BP at home is around 130-138/80. Exercises (walking, biking) daily for 30 minutes w/o SHOB or chest pain.  Wt Readings from Last 3 Encounters:  04/12/14 134 lb (60.782 kg)  12/26/11 144 lb 12.8 oz (65.681 kg)  06/18/11 143 lb (64.864 kg)     Past Medical History  Diagnosis Date  . Hypertension   . Family history of breast cancer     mother, sister  . Hyponatremia     on HCTZ    Current Outpatient Prescriptions  Medication Sig Dispense Refill  . amLODipine (NORVASC) 5 MG tablet daily.      . ramipril (ALTACE) 10 MG capsule Take 1 capsule (10 mg total) by mouth 2 (two) times daily.  180 capsule  0   No current facility-administered medications for this visit.    Allergies:   No Known Allergies  Social History:  The patient  reports that she has quit smoking. Her smoking use included Cigarettes. She smoked 0.00 packs per day. She does not have any smokeless tobacco history on file. She reports that she drinks about 3 ounces of alcohol per week. She reports that she does not use illicit drugs.   Family History:  The patient's family history includes Cancer in her maternal aunt, mother, and sister; Heart disease in her father.   ROS:  Please see the history of present illness.  No nausea, vomiting.  No fevers, chills.  No focal weakness.  No dysuria.    All other systems reviewed and negative.   PHYSICAL EXAM: VS:  BP 124/60  Pulse 88   Ht 5' (1.524 m)  Wt 134 lb (60.782 kg)  BMI 26.17 kg/m2 Well nourished, well developed, in no acute distress HEENT: normal Neck: no JVD, no carotid bruits Cardiac:  normal S1, S2; RRR;  Lungs:  clear to auscultation bilaterally, no wheezing, rhonchi or rales Abd: soft, nontender, no hepatomegaly Ext: tr edema Skin: warm and dry Neuro:   no focal abnormalities noted  EKG:   NSR, PVC  ASSESSMENT AND PLAN:  Essential hypertension, benign  Refill Ramipril Capsule, 10 Milligram, 1 tablet, Orally, twice a day, 90 days, 180, Refills 3 IMAGING: EKG   Harward,[name] 03/17/2013 01:14:47 PM > [name],JAY 03/17/2013 01:33:42 PM > NSR, PVC   Notes: BP has been better controlled. She has lost weight and is exercising regularly. COntinue this lifestyle change. Tried to wean off amlodipine but BP increased.   Edema: Likely related to venous insufficiency.  Elevate legs.  Consider compression stockings.     Signed, Kathleen Mare, [name], [facility] 04/12/2014 1:33 PM

## 2014-04-22 ENCOUNTER — Other Ambulatory Visit: Payer: Self-pay | Admitting: Interventional Cardiology

## 2014-05-31 ENCOUNTER — Other Ambulatory Visit: Payer: Self-pay

## 2014-05-31 MED ORDER — AMLODIPINE BESYLATE 5 MG PO TABS: 5.0000 mg | ORAL_TABLET | Freq: Every day | ORAL | Status: DC

## 2014-06-01 ENCOUNTER — Other Ambulatory Visit: Payer: Self-pay

## 2014-06-01 MED ORDER — AMLODIPINE BESYLATE 5 MG PO TABS: 5.0000 mg | ORAL_TABLET | Freq: Every day | ORAL | Status: DC

## 2014-10-19 ENCOUNTER — Other Ambulatory Visit: Payer: Self-pay | Admitting: Interventional Cardiology

## 2014-10-21 ENCOUNTER — Other Ambulatory Visit: Payer: Self-pay | Admitting: *Deleted

## 2014-10-21 MED ORDER — RAMIPRIL 10 MG PO CAPS: ORAL_CAPSULE | ORAL | Status: DC

## 2015-03-18 ENCOUNTER — Other Ambulatory Visit: Payer: Self-pay | Admitting: Interventional Cardiology

## 2015-04-17 ENCOUNTER — Other Ambulatory Visit: Payer: Self-pay | Admitting: Interventional Cardiology

## 2015-04-26 ENCOUNTER — Other Ambulatory Visit: Payer: Self-pay

## 2015-06-16 ENCOUNTER — Other Ambulatory Visit: Payer: Self-pay | Admitting: Interventional Cardiology

## 2015-06-28 ENCOUNTER — Other Ambulatory Visit: Payer: Self-pay | Admitting: Interventional Cardiology

## 2015-07-08 ENCOUNTER — Ambulatory Visit: Payer: Self-pay | Admitting: Interventional Cardiology

## 2015-09-14 ENCOUNTER — Other Ambulatory Visit: Payer: Self-pay | Admitting: Interventional Cardiology

## 2015-09-26 ENCOUNTER — Other Ambulatory Visit: Payer: Self-pay | Admitting: Interventional Cardiology

## 2015-12-01 ENCOUNTER — Other Ambulatory Visit: Payer: Self-pay | Admitting: Interventional Cardiology

## 2015-12-01 ENCOUNTER — Other Ambulatory Visit: Payer: Self-pay | Admitting: Cardiology

## 2015-12-27 NOTE — Progress Notes (Signed)
Cardiology Office Note:    Date:  12/28/2015   ID:  [name], DOB 1961-07-06, MRN [id number]  PCP:  Jessee Avers., MD  Cardiologist:  Dr. Everette Rank   Electrophysiologist:  n/a  Chief Complaint  Patient presents with  . Follow-up    Hypertension    History of Present Illness:     [name] is a 55 y.o. female with a hx of HTN. Last seen by Dr. Eldridge Dace 6/15. She returns for follow-up. She is the [employer] for [employer].  She lives between [place] and [address]. She continues to see her health care providers here in [place]. Since last seen, she has been doing well. She has occasional, dependent, pedal edema. Otherwise, she denies chest pain, shortness of breath, syncope, orthopnea, PND.    Past Medical History  Diagnosis Date  . Hypertension   . Family history of breast cancer     mother, sister  . Hyponatremia     on HCTZ    Past Surgical History  Procedure Laterality Date  . Breast biopsy  06/29/11    left, atypical ductal hyperplasia with radial scar    Current Medications: Outpatient Prescriptions Prior to Visit  Medication Sig Dispense Refill  . amLODipine (NORVASC) 5 MG tablet Take 1 tablet by mouth  daily 30 tablet 0  . ramipril (ALTACE) 10 MG capsule Take 1 capsule by mouth two times daily  Need appointment for  refills (Patient not taking: Reported on 12/28/2015) 60 capsule 0   No facility-administered medications prior to visit.     Allergies:   Review of patient's allergies indicates no known allergies.   Social History   Social History  . Marital Status: Married    Spouse Name: N/A  . Number of Children: N/A  . Years of Education: N/A   Social History Main Topics  . Smoking status: Former Smoker    Types: Cigarettes  . Smokeless tobacco: None     Comment: quit 4 years ago  . Alcohol Use: 3.0 oz/week    5 Glasses of wine per week     Comment: Weekly  . Drug Use: No  . Sexual Activity: Not Asked    Other Topics Concern  . None   Social History Narrative     Family History:  The patient's family history includes Cancer in her maternal aunt, mother, and sister; Heart disease in her father.   ROS:   Please see the history of present illness.    ROS All other systems reviewed and are negative.   Physical Exam:    VS:  BP 150/80 mmHg  Pulse 88  Ht 5' (1.524 m)  Wt 128 lb 12.8 oz (58.423 kg)  BMI 25.15 kg/m2   GEN: Well nourished, well developed, in no acute distress HEENT: normal Neck: no JVD, no masses Cardiac: Normal S1/S2, RRR; no murmurs, rubs, or gallops, no edema;  no carotid bruits,   Respiratory:  clear to auscultation bilaterally; no wheezing, rhonchi or rales GI: soft, nontender  MS: no deformity or atrophy Skin: warm and dry  Neuro:  no focal deficits  Psych: Alert and oriented x 3, normal affect  Wt Readings from Last 3 Encounters:  12/28/15 128 lb 12.8 oz (58.423 kg)  04/12/14 134 lb (60.782 kg)  12/26/11 144 lb 12.8 oz (65.681 kg)      Studies/Labs Reviewed:     EKG:  EKG is  ordered today.  The ekg ordered today demonstrates NSR, HR 87,  normal axis, NSSTTW changes, QTc 433 ms, no changes  Recent Labs: No results found for requested labs within last 365 days.   Recent Lipid Panel No results found for: CHOL, TRIG, HDL, CHOLHDL, VLDL, LDLCALC, LDLDIRECT  Additional studies/ records that were reviewed today include:   None    ASSESSMENT:     1. Essential hypertension     PLAN:     In order of problems listed above:  1. HTN - BP elevated today. It is usually optimal at home.  She was previously on Altace bid.  She has not taken Norvasc yet today. She sees her Ob/Gyn in 3-4 weeks for an annual CPE.   -  Limit salt  -  Take Altace and Amlodipine together in the AM  -  Monitor BP over 2 weeks and call me with readings.  If 140/90 or higher, will increase Altace back to bid  -  Get FU labs with Ob/Gyn in 3-4 weeks at CPE: BMET, TSH, UA  -   FU 1 year.    Medication Adjustments/Labs and Tests Ordered: Current medicines are reviewed at length with the patient today.  Concerns regarding medicines are outlined above.  Medication changes, Labs and Tests ordered today are outlined in the Patient Instructions noted below. Patient Instructions  Medication Instructions:  Your physician recommends that you continue on your current medications as directed.  Take your Amlodipine and Ramipril together in the morning. Please refer to the Current Medication list given to you today.  Labwork: Have your Ob/Gyn draw the following at your physical: BMET, TSH, Urinalysis  Have her send me a copy.  Testing/Procedures: None ordered  Follow-Up: Your physician recommends that you schedule a follow-up appointment in: 1 year with Dr. Everette Rank   Any Other Special Instructions Will Be Listed Below (If Applicable). Check your blood pressure every day (about 2-3 hours after taking your medications). After 2 weeks, send all the readings to me to review. You can use MyChart or call [phone] and ask for the nurse for [facility], PA-C.  If you need a refill on your cardiac medications before your next appointment, please call your pharmacy.     Signed, Tereso Newcomer, PA-C  12/28/2015 12:16 PM    [facility] [address] [place], [place], [place]  [address] Phone: [phone]; Fax: [phone]

## 2015-12-28 ENCOUNTER — Encounter: Payer: Self-pay | Admitting: Physician Assistant

## 2015-12-28 ENCOUNTER — Ambulatory Visit (INDEPENDENT_AMBULATORY_CARE_PROVIDER_SITE_OTHER): Payer: 59 | Admitting: Physician Assistant

## 2015-12-28 VITALS — BP 150/80 | HR 88 | Ht 60.0 in | Wt 128.8 lb

## 2015-12-28 DIAGNOSIS — I10 Essential (primary) hypertension: Secondary | ICD-10-CM

## 2015-12-28 MED ORDER — RAMIPRIL 10 MG PO CAPS: 10.0000 mg | ORAL_CAPSULE | Freq: Two times a day (BID) | ORAL | Status: DC

## 2015-12-28 MED ORDER — AMLODIPINE BESYLATE 5 MG PO TABS: ORAL_TABLET | ORAL | Status: DC

## 2015-12-28 NOTE — Patient Instructions (Addendum)
Medication Instructions:  Your physician recommends that you continue on your current medications as directed.  Take your Amlodipine and Ramipril together in the morning. Please refer to the Current Medication list given to you today.  Labwork: Have your Ob/Gyn draw the following at your physical: BMET, TSH, Urinalysis  Have her send me a copy.  Testing/Procedures: None ordered  Follow-Up: Your physician recommends that you schedule a follow-up appointment in: 1 year with Dr. Everette Rank   Any Other Special Instructions Will Be Listed Below (If Applicable). Check your blood pressure every day (about 2-3 hours after taking your medications). After 2 weeks, send all the readings to me to review. You can use MyChart or call [phone] and ask for the nurse for [facility], PA-C.  If you need a refill on your cardiac medications before your next appointment, please call your pharmacy.

## 2016-01-02 ENCOUNTER — Other Ambulatory Visit: Payer: Self-pay | Admitting: Interventional Cardiology

## 2016-01-06 ENCOUNTER — Other Ambulatory Visit: Payer: Self-pay | Admitting: Interventional Cardiology

## 2016-04-27 ENCOUNTER — Other Ambulatory Visit: Payer: Self-pay | Admitting: Obstetrics and Gynecology

## 2016-04-27 DIAGNOSIS — Z1231 Encounter for screening mammogram for malignant neoplasm of breast: Secondary | ICD-10-CM

## 2016-05-17 ENCOUNTER — Ambulatory Visit: Payer: 59

## 2016-05-30 ENCOUNTER — Ambulatory Visit: Payer: 59

## 2016-06-15 ENCOUNTER — Other Ambulatory Visit: Payer: Self-pay | Admitting: Physician Assistant

## 2016-10-16 ENCOUNTER — Other Ambulatory Visit: Payer: Self-pay | Admitting: *Deleted

## 2016-10-16 MED ORDER — RAMIPRIL 10 MG PO CAPS: ORAL_CAPSULE | ORAL | 0 refills | Status: DC

## 2016-12-01 ENCOUNTER — Other Ambulatory Visit: Payer: Self-pay | Admitting: Physician Assistant

## 2016-12-12 NOTE — Progress Notes (Signed)
Cardiology Office Note   Date:  12/14/2016   ID:  Kathleen Coombsiane Lips, DOB 1961/03/01, MRN [id number]  PCP:  Jessee AversOLE,TARA J., MD    No chief complaint on file. HTN   Wt Readings from Last 3 Encounters:  12/14/16 135 lb (61.2 kg)  12/28/15 128 lb 12.8 oz (58.4 kg)  04/12/14 134 lb (60.8 kg)       History of Present Illness: [name] is a 56 y.o. female  with HTN.  She has done well over the past year.   Feels well. Denies dizziness, palpitations, chest pain, SHOB, orthopnea, PND. Mild swelling of BLE a few yeara ago but resolved with less salt intake. BP at home is around 120-130/80.    Exercises (walking, biking) daily for 30 minutes w/o SHOB or chest pain.  She goes to a gym and does some classes.   She has not had labs in some time.   Living in [place].  [place] for app[place]      Past Medical History:  Diagnosis Date  . Family history of breast cancer    mother, sister  . Hypertension   . Hyponatremia    on HCTZ    Past Surgical History:  Procedure Laterality Date  . BREAST BIOPSY  06/29/11   left, atypical ductal hyperplasia with radial scar     Current Outpatient Prescriptions  Medication Sig Dispense Refill  . amLODipine (NORVASC) 5 MG tablet TAKE 1 TABLET BY MOUTH  DAILY 90 tablet 0  . FLUARIX QUADRIVALENT 0.5 ML injection Inject as directed once.  0  . ramipril (ALTACE) 10 MG capsule TAKE 1 CAPSULE BY MOUTH TWO TIMES DAILY (Patient taking differently: TAKE 1 CAPSULE BY MOUTH ONCE A DAY) 180 capsule 0   No current facility-administered medications for this visit.     Allergies:   Patient has no known allergies.    Social History:  The patient  reports that she has quit smoking. Her smoking use included Cigarettes. She has never used smokeless tobacco. She reports that she drinks about 3.0 oz of alcohol per week . She reports that she does not use drugs.   Family History:  The patient's family history includes  Cancer in her maternal aunt, mother, and sister; Heart disease in her father.    ROS:  Please see the history of present illness.   Otherwise, review of systems are positive for swelling when she eats extra salt.   All other systems are reviewed and negative.    PHYSICAL EXAM: VS:  BP (!) 150/100 (BP Location: Right Arm, Patient Position: Sitting, Cuff Size: Normal)   Pulse (!) 105   Ht 5' (1.524 m)   Wt 135 lb (61.2 kg)   BMI 26.37 kg/m  , BMI Body mass index is 26.37 kg/m. GEN: Well nourished, well developed, in no acute distress  HEENT: normal  Neck: no JVD, carotid bruits, or masses Cardiac: RRR; no murmurs, rubs, or gallops,no edema  Respiratory:  clear to auscultation bilaterally, normal work of breathing GI: soft, nontender, nondistended, + BS MS: no deformity or atrophy  Skin: warm and dry, no rash Neuro:  Strength and sensation are intact Psych: euthymic mood, full affect   EKG:   The ekg ordered today demonstrates sinus tach, no ST segment changes   Recent Labs: No results found for requested labs within last 8760 hours.   Lipid Panel No results found for: CHOL, TRIG, HDL, CHOLHDL, VLDL, LDLCALC,  LDLDIRECT   Other studies Reviewed: Additional studies/ records that were reviewed today with results demonstrating: No change since the 2015 ECG.   ASSESSMENT AND PLAN:  1. HTN:  BP has been better controlled. She has lost weight and is exercising regularly. COntinue this lifestyle change. Now on amlodipine 5 mg daily with altace 10 mg daily.  Continue current meds.  BP well controlled at home.  2. Edema: Likely related to venous insufficiency.  Elevate legs.  Consider compression stockings.  Better with less intake.  3. HR decreased to 90 after sitting for a while.     Current medicines are reviewed at length with the patient today.  The patient concerns regarding her medicines were addressed.  The following changes have been made:  No change  Labs/ tests  ordered today include:    Orders Placed This Encounter  Procedures  . EKG 12-Lead    Recommend 150 minutes/week of aerobic exercise Low fat, low carb, high fiber diet recommended  Disposition:   FU in 1 year   Signed, Lance Muss, MD  12/14/2016 1:11 PM    [facility] [address] [place], [place], [place]  [address] Phone: [phone]; Fax: [phone]

## 2016-12-14 ENCOUNTER — Ambulatory Visit (INDEPENDENT_AMBULATORY_CARE_PROVIDER_SITE_OTHER): Payer: 59 | Admitting: Interventional Cardiology

## 2016-12-14 ENCOUNTER — Encounter (INDEPENDENT_AMBULATORY_CARE_PROVIDER_SITE_OTHER): Payer: Self-pay

## 2016-12-14 ENCOUNTER — Encounter: Payer: Self-pay | Admitting: Interventional Cardiology

## 2016-12-14 VITALS — BP 150/100 | HR 105 | Ht 60.0 in | Wt 135.0 lb

## 2016-12-14 DIAGNOSIS — R600 Localized edema: Secondary | ICD-10-CM | POA: Diagnosis not present

## 2016-12-14 DIAGNOSIS — I10 Essential (primary) hypertension: Secondary | ICD-10-CM | POA: Diagnosis not present

## 2016-12-14 NOTE — Patient Instructions (Signed)
**Note De-identified [name] Obfuscation** Medication Instructions:  Same-no changes  Labwork: None  Testing/Procedures: None  Follow-Up: Your physician wants you to follow-up in: 1 year. You will receive a reminder letter in the mail two months in advance. If you don't receive a letter, please call our office to schedule the follow-up appointment.      If you need a refill on your cardiac medications before your next appointment, please call your pharmacy.   

## 2017-02-21 ENCOUNTER — Other Ambulatory Visit: Payer: Self-pay | Admitting: Physician Assistant

## 2017-03-01 ENCOUNTER — Other Ambulatory Visit: Payer: Self-pay | Admitting: *Deleted

## 2017-03-01 MED ORDER — RAMIPRIL 10 MG PO CAPS: ORAL_CAPSULE | ORAL | 3 refills | Status: DC

## 2017-03-01 NOTE — Telephone Encounter (Signed)
Rx sent in by refill dept though [facility] needs Rx to be under primary cardiologist, Dr. Eldridge Dace. I sent in new Rx to [facility] per fax under Dr. Eldridge Dace .

## 2017-11-05 ENCOUNTER — Other Ambulatory Visit: Payer: Self-pay | Admitting: Physician Assistant

## 2018-03-03 ENCOUNTER — Ambulatory Visit: Payer: 59 | Admitting: Interventional Cardiology

## 2018-04-15 ENCOUNTER — Other Ambulatory Visit: Payer: Self-pay | Admitting: Interventional Cardiology

## 2018-06-23 ENCOUNTER — Other Ambulatory Visit: Payer: Self-pay | Admitting: Interventional Cardiology

## 2018-06-23 MED ORDER — AMLODIPINE BESYLATE 5 MG PO TABS
5.0000 mg | ORAL_TABLET | Freq: Every day | ORAL | 0 refills | Status: DC
Start: 2018-06-23 — End: 2018-06-23

## 2018-06-23 MED ORDER — AMLODIPINE BESYLATE 5 MG PO TABS: 5.0000 mg | ORAL_TABLET | Freq: Every day | ORAL | 0 refills | Status: DC

## 2018-06-23 NOTE — Telephone Encounter (Signed)
Pt's medication was sent to pt's pharmacy as requested. Confirmation received.  °

## 2018-06-23 NOTE — Telephone Encounter (Signed)
New message ° ° ° °*STAT* If patient is at the pharmacy, call can be transferred to refill team. ° ° °1. Which medications need to be refilled? (please list name of each medication and dose if known) amLODipine (NORVASC) 5 MG tablet ° °2. Which pharmacy/location (including street and city if local pharmacy) is medication to be sent to?[facility] - [place], [place] - [address] ° °3. Do they need a 30 day or 90 day supply? 90 ° °

## 2018-07-02 ENCOUNTER — Encounter: Payer: Self-pay | Admitting: Physician Assistant

## 2018-07-23 ENCOUNTER — Ambulatory Visit: Payer: 59 | Admitting: Physician Assistant

## 2018-08-16 ENCOUNTER — Other Ambulatory Visit: Payer: Self-pay | Admitting: Interventional Cardiology

## 2018-08-26 ENCOUNTER — Ambulatory Visit: Payer: 59 | Admitting: Physician Assistant

## 2018-09-22 NOTE — Progress Notes (Signed)
Cardiology Office Note    Date:  09/23/2018   ID:  [name], DOB Feb 27, 1961, MRN [id number]  PCP:  Gerald Leitz, MD  Cardiologist: Lance Muss, MD EPS: None  Chief Complaint  Patient presents with  . Follow-up    History of Present Illness:  [name] is a 57 y.o. female with history of hypertension.  Last saw Dr. Eldridge Dace 12/14/2016 at which time she was doing well.  Also has chronic lower extremity edema felt secondary to venous insufficiency.  Recommended elevating her legs and decrease salt intake.  Heart rate was up a little on arrival but came down to 90 after sitting awhile. Patient lives between [place] and [address].  Patient comes in for f/u. BP up today. She's been out of her amlodipine for 1 month but checks her BP every am and it's always fine.  Does yoga and cardio 5 days a week.  She does eat out a lot and has gained 7 pounds since last year.  Past Medical History:  Diagnosis Date  . Family history of breast cancer    mother, sister  . Hypertension   . Hyponatremia    on HCTZ    Past Surgical History:  Procedure Laterality Date  . BREAST BIOPSY  06/29/11   left, atypical ductal hyperplasia with radial scar    Current Medications: Current Meds  Medication Sig  . amLODipine (NORVASC) 5 MG tablet Take 1 tablet (5 mg total) by mouth daily.  . ramipril (ALTACE) 10 MG capsule TAKE 1 CAPSULE BY MOUTH  TWICE DAILY  . [DISCONTINUED] amLODipine (NORVASC) 5 MG tablet Take 1 tablet (5 mg total) by mouth daily. Please keep upcoming appt in September before anymore refills. Thank you     Allergies:   Patient has no known allergies.   Social History   Socioeconomic History  . Marital status: Married    Spouse name: Not on file  . Number of children: Not on file  . Years of education: Not on file  . Highest education level: Not on file  Occupational History  . Not on file  Social Needs  . Financial resource strain: Not on file  . Food  insecurity:    Worry: Not on file    Inability: Not on file  . Transportation needs:    Medical: Not on file    Non-medical: Not on file  Tobacco Use  . Smoking status: Former Smoker    Types: Cigarettes  . Smokeless tobacco: Never Used  . Tobacco comment: quit 4 years ago  Substance and Sexual Activity  . Alcohol use: Yes    Alcohol/week: 5.0 standard drinks    Types: 5 Glasses of wine per week    Comment: Weekly  . Drug use: No  . Sexual activity: Not on file  Lifestyle  . Physical activity:    Days per week: Not on file    Minutes per session: Not on file  . Stress: Not on file  Relationships  . Social connections:    Talks on phone: Not on file    Gets together: Not on file    Attends religious service: Not on file    Active member of club or organization: Not on file    Attends meetings of clubs or organizations: Not on file    Relationship status: Not on file  Other Topics Concern  . Not on file  Social History Narrative  . Not on file     Family History:  The patient's family history includes Cancer in her maternal aunt, mother, and sister; Heart disease in her father.   ROS:   Please see the history of present illness.    Review of Systems  Constitution: Negative.  HENT: Negative.   Eyes: Negative.   Cardiovascular: Negative.   Respiratory: Negative.   Hematologic/Lymphatic: Negative.   Musculoskeletal: Negative.  Negative for joint pain.  Gastrointestinal: Negative.   Genitourinary: Negative.   Neurological: Negative.    All other systems reviewed and are negative.   PHYSICAL EXAM:   VS:  BP (!) 162/98   Pulse 91   Ht 5' (1.524 m)   Wt 142 lb 12.8 oz (64.8 kg)   SpO2 98%   BMI 27.89 kg/m   Physical Exam  GEN: Well nourished, well developed, in no acute distress  Neck: no JVD, carotid bruits, or masses Cardiac:RRR; no murmurs, rubs, or gallops  Respiratory:  clear to auscultation bilaterally, normal work of breathing GI: soft, nontender,  nondistended, + BS Ext: without cyanosis, clubbing, or edema, Good distal pulses bilaterally Neuro:  Alert and Oriented x 3 Psych: euthymic mood, full affect  Wt Readings from Last 3 Encounters:  09/23/18 142 lb 12.8 oz (64.8 kg)  12/14/16 135 lb (61.2 kg)  12/28/15 128 lb 12.8 oz (58.4 kg)      Studies/Labs Reviewed:   EKG:  EKG is  ordered today.  The ekg ordered today demonstrates normal sinus rhythm, normal EKG  Recent Labs: No results found for requested labs within last 8760 hours.   Lipid Panel No results found for: CHOL, TRIG, HDL, CHOLHDL, VLDL, LDLCALC, LDLDIRECT  Additional studies/ records that were reviewed today include:      ASSESSMENT:    1. Essential hypertension   2. Bilateral lower extremity edema   3. Mixed hyperlipidemia      PLAN:  In order of problems listed above:  Essential hypertension blood pressure elevated today.  Of asked the patient to resume her amlodipine 5 mg once daily.  2 g sodium diet.  I have asked her to check her blood pressures at different times during the day to see what it is running at home.  She may just have whitecoat syndrome.  Lower extremity edema stable  Hyperlipidemia LDL 143 in February but HDL was over 100.  She has not had this repeated.  She will have it done in [place].    Medication Adjustments/Labs and Tests Ordered: Current medicines are reviewed at length with the patient today.  Concerns regarding medicines are outlined above.  Medication changes, Labs and Tests ordered today are listed in the Patient Instructions below. Patient Instructions   Medication Instructions:  Your physician recommends that you continue on your current medications as directed. Please refer to the Current Medication list given to you today.  If you need a refill on your cardiac medications before your next appointment, please call your pharmacy.   Lab work: None  If you have labs (blood work) drawn today and your tests  are completely normal, you will receive your results only by: Marland Kitchen MyChart Message (if you have MyChart) OR . A paper copy in the mail If you have any lab test that is abnormal or we need to change your treatment, we will call you to review the results.  Testing/Procedures: None   Follow-Up: At [facility], you and your health needs are our priority.  As part of our continuing mission to provide you with exceptional heart care, we have  created designated Provider Care Teams.  These Care Teams include your primary Cardiologist (physician) and Advanced Practice Providers (APPs -  Physician Assistants and Nurse Practitioners) who all work together to provide you with the care you need, when you need it. You will need a follow up appointment in 12 months.  Please call our office 2 months in advance to schedule this appointment.  You may see Lance Muss, MD or one of the following Advanced Practice Providers on your designated Care Team:   Coral, PA-C Ronie Spies, PA-C . Jacolyn Reedy, PA-C  Any Other Special Instructions Will Be Listed Below (If Applicable). Two Gram Sodium Diet 2000 mg  What is Sodium? Sodium is a mineral found naturally in many foods. The most significant source of sodium in the diet is table salt, which is about 40% sodium.  Processed, convenience, and preserved foods also contain a large amount of sodium.  The body needs only 500 mg of sodium daily to function,  A normal diet provides more than enough sodium even if you do not use salt.  Why Limit Sodium? A build up of sodium in the body can cause thirst, increased blood pressure, shortness of breath, and water retention.  Decreasing sodium in the diet can reduce edema and risk of heart attack or stroke associated with high blood pressure.  Keep in mind that there are many other factors involved in these health problems.  Heredity, obesity, lack of exercise, cigarette smoking, stress and what you eat all play a  role.  General Guidelines:  Do not add salt at the table or in cooking.  One teaspoon of salt contains over 2 grams of sodium.  Read food labels  Avoid processed and convenience foods  Ask your dietitian before eating any foods not dicussed in the menu planning guidelines  Consult your physician if you wish to use a salt substitute or a sodium containing medication such as antacids.  Limit milk and milk products to 16 oz (2 cups) per day.  Shopping Hints:  READ LABELS!! "Dietetic" does not necessarily mean low sodium.  Salt and other sodium ingredients are often added to foods during processing.   Menu Planning Guidelines Food Group Choose More Often Avoid  Beverages (see also the milk group All fruit juices, low-sodium, salt-free vegetables juices, low-sodium carbonated beverages Regular vegetable or tomato juices, commercially softened water used for drinking or cooking  Breads and Cereals Enriched white, wheat, rye and pumpernickel bread, hard rolls and dinner rolls; muffins, cornbread and waffles; most dry cereals, cooked cereal without added salt; unsalted crackers and breadsticks; low sodium or homemade bread crumbs Bread, rolls and crackers with salted tops; quick breads; instant hot cereals; pancakes; commercial bread stuffing; self-rising flower and biscuit mixes; regular bread crumbs or cracker crumbs  Desserts and Sweets Desserts and sweets mad with mild should be within allowance Instant pudding mixes and cake mixes  Fats Butter or margarine; vegetable oils; unsalted salad dressings, regular salad dressings limited to 1 Tbs; light, sour and heavy cream Regular salad dressings containing bacon fat, bacon bits, and salt pork; snack dips made with instant soup mixes or processed cheese; salted nuts  Fruits Most fresh, frozen and canned fruits Fruits processed with salt or sodium-containing ingredient (some dried fruits are processed with sodium sulfites        Vegetables  Fresh, frozen vegetables and low- sodium canned vegetables Regular canned vegetables, sauerkraut, pickled vegetables, and others prepared in brine; frozen vegetables in sauces; vegetables seasoned  with ham, bacon or salt pork  Condiments, Sauces, Miscellaneous  Salt substitute with physician's approval; pepper, herbs, spices; vinegar, lemon or lime juice; hot pepper sauce; garlic powder, onion powder, low sodium soy sauce (1 Tbs.); low sodium condiments (ketchup, chili sauce, mustard) in limited amounts (1 tsp.) fresh ground horseradish; unsalted tortilla chips, pretzels, potato chips, popcorn, salsa (1/4 cup) Any seasoning made with salt including garlic salt, celery salt, onion salt, and seasoned salt; sea salt, rock salt, kosher salt; meat tenderizers; monosodium glutamate; mustard, regular soy sauce, barbecue, sauce, chili sauce, teriyaki sauce, steak sauce, Worcestershire sauce, and most flavored vinegars; canned gravy and mixes; regular condiments; salted snack foods, olives, picles, relish, horseradish sauce, catsup   Food preparation: Try these seasonings Meats:    Pork Sage, onion Serve with applesauce  Chicken Poultry seasoning, thyme, parsley Serve with cranberry sauce  Lamb Curry powder, rosemary, garlic, thyme Serve with mint sauce or jelly  Veal Marjoram, basil Serve with current jelly, cranberry sauce  Beef Pepper, bay leaf Serve with dry mustard, unsalted chive butter  Fish [place] leaf, dill Serve with unsalted lemon butter, unsalted parsley butter  Vegetables:    Asparagus Lemon juice   Broccoli Lemon juice   Carrots Mustard dressing parsley, mint, nutmeg, glazed with unsalted butter and sugar   Green beans Marjoram, lemon juice, nutmeg,dill seed   Tomatoes Basil, marjoram, onion   Spice /blend for [employer]" 4 tsp ground thyme 1 tsp ground sage 3 tsp ground rosemary 4 tsp ground marjoram   Test your knowledge 1. A product that says "Salt Free" may still contain sodium. True  or False 2. Garlic Powder and Hot Pepper Sauce an be used as alternative seasonings.True or False 3. Processed foods have more sodium than fresh foods.  True or False 4. Canned Vegetables have less sodium than froze True or False  WAYS TO DECREASE YOUR SODIUM INTAKE 1. Avoid the use of added salt in cooking and at the table.  Table salt (and other prepared seasonings which contain salt) is probably one of the greatest sources of sodium in the diet.  Unsalted foods can gain flavor from the sweet, sour, and butter taste sensations of herbs and spices.  Instead of using salt for seasoning, try the following seasonings with the foods listed.  Remember: how you use them to enhance natural food flavors is limited only by your creativity... Allspice-Meat, fish, eggs, fruit, peas, red and yellow vegetables Almond Extract-Fruit baked goods Anise Seed-Sweet breads, fruit, carrots, beets, cottage cheese, cookies (tastes like licorice) Basil-Meat, fish, eggs, vegetables, rice, vegetables salads, soups, sauces Bay Leaf-Meat, fish, stews, poultry Burnet-Salad, vegetables (cucumber-like flavor) Caraway Seed-Bread, cookies, cottage cheese, meat, vegetables, cheese, rice Cardamon-Baked goods, fruit, soups Celery Powder or seed-Salads, salad dressings, sauces, meatloaf, soup, bread.Do not use  celery salt Chervil-Meats, salads, fish, eggs, vegetables, cottage cheese (parsley-like flavor) Chili Power-Meatloaf, chicken cheese, corn, eggplant, egg dishes Chives-Salads cottage cheese, egg dishes, soups, vegetables, sauces Cilantro-Salsa, casseroles Cinnamon-Baked goods, fruit, pork, lamb, chicken, carrots Cloves-Fruit, baked goods, fish, pot roast, green beans, beets, carrots Coriander-Pastry, cookies, meat, salads, cheese (lemon-orange flavor) Cumin-Meatloaf, fish,cheese, eggs, cabbage,fruit pie (caraway flavor) [employer], fruit, eggs, fish, poultry, cottage cheese, vegetables Dill Seed-Meat, cottage  cheese, poultry, vegetables, fish, salads, bread Fennel Seed-Bread, cookies, apples, pork, eggs, fish, beets, cabbage, cheese, Licorice-like flavor Garlic-(buds or powder) Salads, meat, poultry, fish, bread, butter, vegetables, potatoes.Do not  use garlic salt Ginger-Fruit, vegetables, baked goods, meat, fish, poultry Horseradish Root-Meet, vegetables, butter Lemon Juice  or Extract-Vegetables, fruit, tea, baked goods, fish salads Mace-Baked goods fruit, vegetables, fish, poultry (taste like nutmeg) Maple Extract-Syrups Marjoram-Meat, chicken, fish, vegetables, breads, green salads (taste like Sage) Mint-Tea, lamb, sherbet, vegetables, desserts, carrots, cabbage Mustard, Dry or Seed-Cheese, eggs, meats, vegetables, poultry Nutmeg-Baked goods, fruit, chicken, eggs, vegetables, desserts Onion Powder-Meat, fish, poultry, vegetables, cheese, eggs, bread, rice salads (Do not use   Onion salt) Orange Extract-Desserts, baked goods Oregano-Pasta, eggs, cheese, onions, pork, lamb, fish, chicken, vegetables, green salads Paprika-Meat, fish, poultry, eggs, cheese, vegetables Parsley Flakes-Butter, vegetables, meat fish, poultry, eggs, bread, salads (certain forms may   Contain sodium Pepper-Meat fish, poultry, vegetables, eggs Peppermint Extract-Desserts, baked goods Poppy Seed-Eggs, bread, cheese, fruit dressings, baked goods, noodles, vegetables, cottage  [employer] RxCheese Poultry Seasoning-Poultry,veal Rosemary-Lamb, poultry, meat, fish, cauliflower, turnips,eggs bread Saffron-Rice, bread, veal, chicken, fish, eggs Sage-Meat, fish, poultry, onions, eggplant, tomateos, pork, stews Savory-Eggs, salads, poultry, meat, rice, vegetables, soups, pork Tarragon-Meat, poultry, fish, eggs, butter, vegetables (licorice-like flavor)  Thyme-Meat, poultry, fish, eggs, vegetables, (clover-like flavor), sauces, soups Tumeric-Salads, butter, eggs, fish, rice, vegetables (saffron-like flavor) Vanilla Extract-Baked goods,  candy Vinegar-Salads, vegetables, meat marinades Walnut Extract-baked goods, candy  2. Choose your Foods Wisely   The following is a list of foods to avoid which are high in sodium:  Meats-Avoid all smoked, canned, salt cured, dried and kosher meat and fish as well as Anchovies   Lox Freescale SemiconductorBacon    Luncheon meats:Bologna, Liverwurst, Pastrami Canned meat or fish  Marinated herring Caviar    Pepperoni Corned Beef   Pizza Dried chipped beef  Salami Frozen breaded fish or meat Salt pork Frankfurters or hot dogs  Sardines Gefilte fish   Sausage Ham (boiled ham, Proscuitto Smoked butt    spiced ham)   Spam      TV Dinners Vegetables Canned vegetables (Regular) Relish Canned mushrooms  Sauerkraut Olives    Tomato juice Pickles  Bakery and Dessert Products Canned puddings  Cream pies Cheesecake   Decorated cakes Cookies  Beverages/Juices Tomato juice, regular  Gatorade   V-8 vegetable juice, regular  Breads and Cereals Biscuit mixes   Salted potato chips, corn chips, pretzels Bread stuffing mixes  Salted crackers and rolls Pancake and waffle mixes Self-rising flour  Seasonings Accent    Meat sauces Barbecue sauce  Meat tenderizer Catsup    Monosodium glutamate (MSG) Celery salt   Onion salt Chili sauce   Prepared mustard Garlic salt   Salt, seasoned salt, sea salt Gravy mixes   Soy sauce Horseradish   Steak sauce Ketchup   Tartar sauce Lite salt    Teriyaki sauce Marinade mixes   Worcestershire sauce  Others Baking powder   Cocoa and cocoa mixes Baking soda   Commercial casserole mixes Candy-caramels, chocolate  Dehydrated soups    Bars, fudge,nougats  Instant rice and pasta mixes Canned broth or soup  Maraschino cherries Cheese, aged and processed cheese and cheese spreads  Learning Assessment Quiz  Indicated T (for True) or F (for False) for each of the following statements:  1. _____ Fresh fruits and vegetables and unprocessed grains are generally low in  sodium 2. _____ Water may contain a considerable amount of sodium, depending on the source 3. _____ You can always tell if a food is high in sodium by tasting it 4. _____ Certain laxatives my be high in sodium and should be avoided unless prescribed   by a physician or pharmacist 5. _____ Salt substitutes may be used freely by anyone on a sodium restricted  diet 6. _____ Sodium is present in table salt, food additives and as a natural component of   most foods 7. _____ Table salt is approximately 90% sodium 8. _____ Limiting sodium intake may help prevent excess fluid accumulation in the body 9. _____ On a sodium-restricted diet, seasonings such as bouillon soy sauce, and    cooking wine should be used in place of table salt 10. _____ On an ingredient list, a product which lists monosodium glutamate as the first   ingredient is an appropriate food to include on a low sodium diet  Circle the best answer(s) to the following statements (Hint: there may be more than one correct answer)  11. On a low-sodium diet, some acceptable snack items are:    A. Olives  F. Bean dip   K. Grapefruit juice    B. Salted Pretzels G. Commercial Popcorn   L. Canned peaches    C. Carrot Sticks  H. Bouillon   M. Unsalted nuts   D. [place] fries  I. Peanut butter crackers N. Salami   E. Sweet pickles J. Tomato Juice   O. Pizza  12.  Seasonings that may be used freely on a reduced - sodium diet include   A. Lemon wedges F.Monosodium glutamate K. Celery seed    B.Soysauce   G. Pepper   L. Mustard powder   C. Sea salt  H. Cooking wine  M. Onion flakes   D. Vinegar  E. Prepared horseradish N. Salsa   E. Sage   J. Worcestershire sauce  O. [address]       Elson Clan, PA-C  09/23/2018 12:45 PM    [facility] [address] [place], [place], [place]  [address] Phone: [phone]; Fax: [phone]

## 2018-09-23 ENCOUNTER — Encounter: Payer: Self-pay | Admitting: Physician Assistant

## 2018-09-23 ENCOUNTER — Ambulatory Visit (INDEPENDENT_AMBULATORY_CARE_PROVIDER_SITE_OTHER): Payer: 59 | Admitting: Physician Assistant

## 2018-09-23 VITALS — BP 162/98 | HR 91 | Ht 60.0 in | Wt 142.8 lb

## 2018-09-23 DIAGNOSIS — E785 Hyperlipidemia, unspecified: Secondary | ICD-10-CM | POA: Insufficient documentation

## 2018-09-23 DIAGNOSIS — R600 Localized edema: Secondary | ICD-10-CM

## 2018-09-23 DIAGNOSIS — I10 Essential (primary) hypertension: Secondary | ICD-10-CM | POA: Diagnosis not present

## 2018-09-23 DIAGNOSIS — E782 Mixed hyperlipidemia: Secondary | ICD-10-CM

## 2018-09-23 MED ORDER — AMLODIPINE BESYLATE 5 MG PO TABS: 5.0000 mg | ORAL_TABLET | Freq: Every day | ORAL | 3 refills | Status: DC

## 2018-09-23 NOTE — Patient Instructions (Signed)
Medication Instructions:  Your physician recommends that you continue on your current medications as directed. Please refer to the Current Medication list given to you today.  If you need a refill on your cardiac medications before your next appointment, please call your pharmacy.   Lab work: None  If you have labs (blood work) drawn today and your tests are completely normal, you will receive your results only by: Marland Kitchen. MyChart Message (if you have MyChart) OR . A paper copy in the mail If you have any lab test that is abnormal or we need to change your treatment, we will call you to review the results.  Testing/Procedures: None   Follow-Up: At [facility], you and your health needs are our priority.  As part of our continuing mission to provide you with exceptional heart care, we have created designated Provider Care Teams.  These Care Teams include your primary Cardiologist (physician) and Advanced Practice Providers (APPs -  Physician Assistants and Nurse Practitioners) who all work together to provide you with the care you need, when you need it. You will need a follow up appointment in 12 months.  Please call our office 2 months in advance to schedule this appointment.  You may see Lance MussJayadeep Varanasi, MD or one of the following Advanced Practice Providers on your designated Care Team:   [place]itt[place]y Simmons, PA-C Ronie Spiesayna Dunn, PA-C . Jacolyn ReedyMichele Lenze, PA-C  Any Other Special Instructions Will Be Listed Below (If Applicable). Two Gram Sodium Diet 2000 mg  What is Sodium? Sodium is a mineral found naturally in many foods. The most significant source of sodium in the diet is table salt, which is about 40% sodium.  Processed, convenience, and preserved foods also contain a large amount of sodium.  The body needs only 500 mg of sodium daily to function,  A normal diet provides more than enough sodium even if you do not use salt.  Why Limit Sodium? A build up of sodium in the body can cause thirst,  increased blood pressure, shortness of breath, and water retention.  Decreasing sodium in the diet can reduce edema and risk of heart attack or stroke associated with high blood pressure.  Keep in mind that there are many other factors involved in these health problems.  Heredity, obesity, lack of exercise, cigarette smoking, stress and what you eat all play a role.  General Guidelines:  Do not add salt at the table or in cooking.  One teaspoon of salt contains over 2 grams of sodium.  Read food labels  Avoid processed and convenience foods  Ask your dietitian before eating any foods not dicussed in the menu planning guidelines  Consult your physician if you wish to use a salt substitute or a sodium containing medication such as antacids.  Limit milk and milk products to 16 oz (2 cups) per day.  Shopping Hints:  READ LABELS!! "Dietetic" does not necessarily mean low sodium.  Salt and other sodium ingredients are often added to foods during processing.   Menu Planning Guidelines Food Group Choose More Often Avoid  Beverages (see also the milk group All fruit juices, low-sodium, salt-free vegetables juices, low-sodium carbonated beverages Regular vegetable or tomato juices, commercially softened water used for drinking or cooking  Breads and Cereals Enriched white, wheat, rye and pumpernickel bread, hard rolls and dinner rolls; muffins, cornbread and waffles; most dry cereals, cooked cereal without added salt; unsalted crackers and breadsticks; low sodium or homemade bread crumbs Bread, rolls and crackers with salted  tops; quick breads; instant hot cereals; pancakes; commercial bread stuffing; self-rising flower and biscuit mixes; regular bread crumbs or cracker crumbs  Desserts and Sweets Desserts and sweets mad with mild should be within allowance Instant pudding mixes and cake mixes  Fats Butter or margarine; vegetable oils; unsalted salad dressings, regular salad dressings limited to 1  Tbs; light, sour and heavy cream Regular salad dressings containing bacon fat, bacon bits, and salt pork; snack dips made with instant soup mixes or processed cheese; salted nuts  Fruits Most fresh, frozen and canned fruits Fruits processed with salt or sodium-containing ingredient (some dried fruits are processed with sodium sulfites        Vegetables Fresh, frozen vegetables and low- sodium canned vegetables Regular canned vegetables, sauerkraut, pickled vegetables, and others prepared in brine; frozen vegetables in sauces; vegetables seasoned with ham, bacon or salt pork  Condiments, Sauces, Miscellaneous  Salt substitute with physician's approval; pepper, herbs, spices; vinegar, lemon or lime juice; hot pepper sauce; garlic powder, onion powder, low sodium soy sauce (1 Tbs.); low sodium condiments (ketchup, chili sauce, mustard) in limited amounts (1 tsp.) fresh ground horseradish; unsalted tortilla chips, pretzels, potato chips, popcorn, salsa (1/4 cup) Any seasoning made with salt including garlic salt, celery salt, onion salt, and seasoned salt; sea salt, rock salt, kosher salt; meat tenderizers; monosodium glutamate; mustard, regular soy sauce, barbecue, sauce, chili sauce, teriyaki sauce, steak sauce, Worcestershire sauce, and most flavored vinegars; canned gravy and mixes; regular condiments; salted snack foods, olives, picles, relish, horseradish sauce, catsup   Food preparation: Try these seasonings Meats:    Pork Sage, onion Serve with applesauce  Chicken Poultry seasoning, thyme, parsley Serve with cranberry sauce  Lamb Curry powder, rosemary, garlic, thyme Serve with mint sauce or jelly  Veal Marjoram, basil Serve with current jelly, cranberry sauce  Beef Pepper, bay leaf Serve with dry mustard, unsalted chive butter  Fish [place] leaf, dill Serve with unsalted lemon butter, unsalted parsley butter  Vegetables:    Asparagus Lemon juice   Broccoli Lemon juice   Carrots Mustard  dressing parsley, mint, nutmeg, glazed with unsalted butter and sugar   Green beans Marjoram, lemon juice, nutmeg,dill seed   Tomatoes Basil, marjoram, onion   Spice /blend for [employer]" 4 tsp ground thyme 1 tsp ground sage 3 tsp ground rosemary 4 tsp ground marjoram   Test your knowledge 1. A product that says "Salt Free" may still contain sodium. True or False 2. Garlic Powder and Hot Pepper Sauce an be used as alternative seasonings.True or False 3. Processed foods have more sodium than fresh foods.  True or False 4. Canned Vegetables have less sodium than froze True or False  WAYS TO DECREASE YOUR SODIUM INTAKE 1. Avoid the use of added salt in cooking and at the table.  Table salt (and other prepared seasonings which contain salt) is probably one of the greatest sources of sodium in the diet.  Unsalted foods can gain flavor from the sweet, sour, and butter taste sensations of herbs and spices.  Instead of using salt for seasoning, try the following seasonings with the foods listed.  Remember: how you use them to enhance natural food flavors is limited only by your creativity... Allspice-Meat, fish, eggs, fruit, peas, red and yellow vegetables Almond Extract-Fruit baked goods Anise Seed-Sweet breads, fruit, carrots, beets, cottage cheese, cookies (tastes like licorice) Basil-Meat, fish, eggs, vegetables, rice, vegetables salads, soups, sauces Bay Leaf-Meat, fish, stews, poultry Burnet-Salad, vegetables (cucumber-like flavor) Caraway Seed-Bread, cookies,  cottage cheese, meat, vegetables, cheese, rice Cardamon-Baked goods, fruit, soups Celery Powder or seed-Salads, salad dressings, sauces, meatloaf, soup, bread.Do not use  celery salt Chervil-Meats, salads, fish, eggs, vegetables, cottage cheese (parsley-like flavor) Chili Power-Meatloaf, chicken cheese, corn, eggplant, egg dishes Chives-Salads cottage cheese, egg dishes, soups, vegetables, sauces Cilantro-Salsa,  casseroles Cinnamon-Baked goods, fruit, pork, lamb, chicken, carrots Cloves-Fruit, baked goods, fish, pot roast, green beans, beets, carrots Coriander-Pastry, cookies, meat, salads, cheese (lemon-orange flavor) Cumin-Meatloaf, fish,cheese, eggs, cabbage,fruit pie (caraway flavor) [employer], fruit, eggs, fish, poultry, cottage cheese, vegetables Dill Seed-Meat, cottage cheese, poultry, vegetables, fish, salads, bread Fennel Seed-Bread, cookies, apples, pork, eggs, fish, beets, cabbage, cheese, Licorice-like flavor Garlic-(buds or powder) Salads, meat, poultry, fish, bread, butter, vegetables, potatoes.Do not  use garlic salt Ginger-Fruit, vegetables, baked goods, meat, fish, poultry Horseradish Root-Meet, vegetables, butter Lemon Juice or Extract-Vegetables, fruit, tea, baked goods, fish salads Mace-Baked goods fruit, vegetables, fish, poultry (taste like nutmeg) Maple Extract-Syrups Marjoram-Meat, chicken, fish, vegetables, breads, green salads (taste like Sage) Mint-Tea, lamb, sherbet, vegetables, desserts, carrots, cabbage Mustard, Dry or Seed-Cheese, eggs, meats, vegetables, poultry Nutmeg-Baked goods, fruit, chicken, eggs, vegetables, desserts Onion Powder-Meat, fish, poultry, vegetables, cheese, eggs, bread, rice salads (Do not use   Onion salt) Orange Extract-Desserts, baked goods Oregano-Pasta, eggs, cheese, onions, pork, lamb, fish, chicken, vegetables, green salads Paprika-Meat, fish, poultry, eggs, cheese, vegetables Parsley Flakes-Butter, vegetables, meat fish, poultry, eggs, bread, salads (certain forms may   Contain sodium Pepper-Meat fish, poultry, vegetables, eggs Peppermint Extract-Desserts, baked goods Poppy Seed-Eggs, bread, cheese, fruit dressings, baked goods, noodles, vegetables, cottage  [employer], poultry, meat, fish, cauliflower, turnips,eggs bread Saffron-Rice, bread, veal, chicken, fish, eggs Sage-Meat, fish,  poultry, onions, eggplant, tomateos, pork, stews Savory-Eggs, salads, poultry, meat, rice, vegetables, soups, pork Tarragon-Meat, poultry, fish, eggs, butter, vegetables (licorice-like flavor)  Thyme-Meat, poultry, fish, eggs, vegetables, (clover-like flavor), sauces, soups Tumeric-Salads, butter, eggs, fish, rice, vegetables (saffron-like flavor) Vanilla Extract-Baked goods, candy Vinegar-Salads, vegetables, meat marinades Walnut Extract-baked goods, candy  2. Choose your Foods Wisely   The following is a list of foods to avoid which are high in sodium:  Meats-Avoid all smoked, canned, salt cured, dried and kosher meat and fish as well as Anchovies   [employer] meats:Bologna, Liverwurst, Pastrami Canned meat or fish  Marinated herring Caviar    Pepperoni Corned Beef   Pizza Dried chipped beef  Salami Frozen breaded fish or meat Salt pork Frankfurters or hot dogs  Sardines Gefilte fish   Sausage Ham (boiled ham, Proscuitto Smoked butt    spiced ham)   Spam      TV Dinners Vegetables Canned vegetables (Regular) Relish Canned mushrooms  Sauerkraut Olives    Tomato juice Pickles  Bakery and Dessert Products Canned puddings  Cream pies Cheesecake   Decorated cakes Cookies  Beverages/Juices Tomato juice, regular  Gatorade   V-8 vegetable juice, regular  Breads and Cereals Biscuit mixes   Salted potato chips, corn chips, pretzels Bread stuffing mixes  Salted crackers and rolls Pancake and waffle mixes Self-rising flour  Seasonings Accent    Meat sauces Barbecue sauce  Meat tenderizer Catsup    Monosodium glutamate (MSG) Celery salt   Onion salt Chili sauce   Prepared mustard Garlic salt   Salt, seasoned salt, sea salt Gravy mixes   Soy sauce Horseradish   Steak sauce Ketchup   Tartar sauce Lite salt    Teriyaki sauce Marinade mixes   Worcestershire sauce  Others Baking powder  Cocoa and cocoa mixes Baking soda   Commercial casserole  mixes Candy-caramels, chocolate  Dehydrated soups    Bars, fudge,nougats  Instant rice and pasta mixes Canned broth or soup  Maraschino cherries Cheese, aged and processed cheese and cheese spreads  Learning Assessment Quiz  Indicated T (for True) or F (for False) for each of the following statements:  1. _____ Fresh fruits and vegetables and unprocessed grains are generally low in sodium 2. _____ Water may contain a considerable amount of sodium, depending on the source 3. _____ You can always tell if a food is high in sodium by tasting it 4. _____ Certain laxatives my be high in sodium and should be avoided unless prescribed   by a physician or pharmacist 5. _____ Salt substitutes may be used freely by anyone on a sodium restricted diet 6. _____ Sodium is present in table salt, food additives and as a natural component of   most foods 7. _____ Table salt is approximately 90% sodium 8. _____ Limiting sodium intake may help prevent excess fluid accumulation in the body 9. _____ On a sodium-restricted diet, seasonings such as bouillon soy sauce, and    cooking wine should be used in place of table salt 10. _____ On an ingredient list, a product which lists monosodium glutamate as the first   ingredient is an appropriate food to include on a low sodium diet  Circle the best answer(s) to the following statements (Hint: there may be more than one correct answer)  11. On a low-sodium diet, some acceptable snack items are:    A. Olives  F. Bean dip   K. Grapefruit juice    B. Salted Pretzels G. Commercial Popcorn   L. Canned peaches    C. Carrot Sticks  H. Bouillon   M. Unsalted nuts   D. [place] fries  I. Peanut butter crackers N. Salami   E. Sweet pickles J. Tomato Juice   O. Pizza  12.  Seasonings that may be used freely on a reduced - sodium diet include   A. Lemon wedges F.Monosodium glutamate K. Celery seed    B.Soysauce   G. Pepper   L. Mustard powder   C. Sea salt  H.  Cooking wine  M. Onion flakes   D. Vinegar  E. Prepared horseradish N. Salsa   E. Sage   J. Worcestershire sauce  O. Chutney

## 2018-09-25 ENCOUNTER — Other Ambulatory Visit: Payer: Self-pay | Admitting: *Deleted

## 2018-09-25 MED ORDER — AMLODIPINE BESYLATE 5 MG PO TABS: 5.0000 mg | ORAL_TABLET | Freq: Every day | ORAL | 3 refills | Status: DC

## 2018-10-17 ENCOUNTER — Other Ambulatory Visit: Payer: Self-pay | Admitting: Interventional Cardiology

## 2018-12-23 ENCOUNTER — Other Ambulatory Visit: Payer: Self-pay

## 2018-12-23 MED ORDER — AMLODIPINE BESYLATE 5 MG PO TABS: 5.0000 mg | ORAL_TABLET | Freq: Every day | ORAL | 3 refills | Status: DC

## 2019-10-21 ENCOUNTER — Ambulatory Visit: Payer: 59 | Admitting: Interventional Cardiology

## 2019-11-11 ENCOUNTER — Other Ambulatory Visit: Payer: Self-pay | Admitting: Physician Assistant

## 2019-11-11 ENCOUNTER — Other Ambulatory Visit: Payer: Self-pay | Admitting: Interventional Cardiology

## 2019-11-26 ENCOUNTER — Ambulatory Visit: Payer: 59 | Admitting: Interventional Cardiology

## 2020-01-31 ENCOUNTER — Other Ambulatory Visit: Payer: Self-pay | Admitting: Interventional Cardiology

## 2020-01-31 ENCOUNTER — Other Ambulatory Visit: Payer: Self-pay | Admitting: Physician Assistant

## 2020-02-16 ENCOUNTER — Other Ambulatory Visit: Payer: Self-pay | Admitting: Interventional Cardiology

## 2020-02-17 ENCOUNTER — Other Ambulatory Visit: Payer: Self-pay

## 2020-02-17 MED ORDER — AMLODIPINE BESYLATE 5 MG PO TABS: 5.0000 mg | ORAL_TABLET | Freq: Every day | ORAL | 0 refills | Status: DC

## 2020-02-21 NOTE — Progress Notes (Signed)
Cardiology Office Note   Date:  02/22/2020   ID:  Rhae Lerner, DOB October 29, 1961, MRN [id number]  PCP:  Christophe Louis, MD    No chief complaint on file.  HTN  Wt Readings from Last 3 Encounters:  02/22/20 145 lb 1.9 oz (65.8 kg)  09/23/18 142 lb 12.8 oz (64.8 kg)  12/14/16 135 lb (61.2 kg)       History of Present Illness: [name] is a 59 y.o. female  Who I have seen for many years for HTN.  Moved to [place] but came back to [place] for apts.  Last seen in 2018.  She has stayed well during [place].  Got 1 vaccine so far.  BP has ben controlled at home.  Denies : Chest pain. Dizziness. Leg edema. Nitroglycerin use. Orthopnea. Palpitations. Paroxysmal nocturnal dyspnea. Shortness of breath. Syncope.   Highest readings are in the 130s/80s.   She is exercising daily.  She feels well with this.     Past Medical History:  Diagnosis Date  . Family history of breast cancer    mother, sister  . Hypertension   . Hyponatremia    on HCTZ    Past Surgical History:  Procedure Laterality Date  . BREAST BIOPSY  06/29/11   left, atypical ductal hyperplasia with radial scar     Current Outpatient Medications  Medication Sig Dispense Refill  . amLODipine (NORVASC) 5 MG tablet Take 1 tablet (5 mg total) by mouth daily. Please keep upcoming appt in April with Dr. Irish Lack before anymore refills. Thank you 90 tablet 0  . ramipril (ALTACE) 10 MG capsule TAKE 1 CAPSULE BY MOUTH  TWICE DAILY. 180 capsule 0   No current facility-administered medications for this visit.    Allergies:   Patient has no known allergies.    Social History:  The patient  reports that she has quit smoking. Her smoking use included cigarettes. She has never used smokeless tobacco. She reports current alcohol use of about 5.0 standard drinks of alcohol per week. She reports that she does not use drugs.   Family History:  The patient's family history includes Cancer in her maternal aunt, mother, and  sister; Heart disease in her father.    ROS:  Please see the history of present illness.   Otherwise, review of systems are positive for increased eating during COVID.   All other systems are reviewed and negative.    PHYSICAL EXAM: VS:  BP 132/88   Pulse 99   Ht 5' (1.524 m)   Wt 145 lb 1.9 oz (65.8 kg)   SpO2 96%   BMI 28.34 kg/m  , BMI Body mass index is 28.34 kg/m. GEN: Well nourished, well developed, in no acute distress  HEENT: normal  Neck: no JVD, carotid bruits, or masses Cardiac: RRR; no murmurs, rubs, or gallops,no edema  Respiratory:  clear to auscultation bilaterally, normal work of breathing GI: soft, nontender, nondistended, + BS MS: no deformity or atrophy  Skin: warm and dry, no rash Neuro:  Strength and sensation are intact Psych: euthymic mood, full affect   EKG:   The ekg ordered today demonstrates NSR , no ST changes   Recent Labs: No results found for requested labs within last 8760 hours.   Lipid Panel No results found for: CHOL, TRIG, HDL, CHOLHDL, VLDL, LDLCALC, LDLDIRECT   Other studies Reviewed: Additional studies/ records that were reviewed today with results demonstrating: no recent labs available.   ASSESSMENT AND PLAN:  1.  HTN: Was on amlodipine 5 mg daily and ramipril 10 mg daily. BP controlled. COntinue exercise.  Watch salt intake.  2. Edema: Elevate legs.  Controlled.  3. Vaccinated for COVID.     Current medicines are reviewed at length with the patient today.  The patient concerns regarding her medicines were addressed.  The following changes have been made:  No change  Labs/ tests ordered today include:  No orders of the defined types were placed in this encounter.   Recommend 150 minutes/week of aerobic exercise Low fat, low carb, high fiber diet recommended  Disposition:   FU in 1 year   Signed, Lance Muss, MD  02/22/2020 10:54 AM    [facility] [address] [place], [place], [place]   [address] Phone: [phone]; Fax: [phone]

## 2020-02-22 ENCOUNTER — Other Ambulatory Visit: Payer: Self-pay

## 2020-02-22 ENCOUNTER — Encounter: Payer: Self-pay | Admitting: Interventional Cardiology

## 2020-02-22 ENCOUNTER — Ambulatory Visit (INDEPENDENT_AMBULATORY_CARE_PROVIDER_SITE_OTHER): Payer: 59 | Admitting: Interventional Cardiology

## 2020-02-22 VITALS — BP 132/88 | HR 99 | Ht 60.0 in | Wt 145.1 lb

## 2020-02-22 DIAGNOSIS — E782 Mixed hyperlipidemia: Secondary | ICD-10-CM

## 2020-02-22 DIAGNOSIS — R600 Localized edema: Secondary | ICD-10-CM

## 2020-02-22 DIAGNOSIS — I10 Essential (primary) hypertension: Secondary | ICD-10-CM | POA: Diagnosis not present

## 2020-02-22 LAB — COMPREHENSIVE METABOLIC PANEL
ALT: 94 IU/L — ABNORMAL HIGH (ref 0–32)
AST: 96 IU/L — ABNORMAL HIGH (ref 0–40)
Albumin/Globulin Ratio: 1.7 (ref 1.2–2.2)
Albumin: 4.6 g/dL (ref 3.8–4.9)
Alkaline Phosphatase: 86 IU/L (ref 39–117)
BUN/Creatinine Ratio: 13 (ref 12–28)
BUN: 9 mg/dL (ref 8–27)
Bilirubin Total: 0.4 mg/dL (ref 0.0–1.2)
CO2: 19 mmol/L — ABNORMAL LOW (ref 20–29)
Calcium: 10 mg/dL (ref 8.7–10.3)
Chloride: 97 mmol/L (ref 96–106)
Creatinine, Ser: 0.69 mg/dL (ref 0.57–1.00)
GFR calc Af Amer: 109 mL/min/{1.73_m2} (ref >59)
GFR calc non Af Amer: 95 mL/min/{1.73_m2} (ref >59)
Globulin, Total: 2.7 g/dL (ref 1.5–4.5)
Glucose: 103 mg/dL — ABNORMAL HIGH (ref 65–99)
Potassium: 4.7 mmol/L (ref 3.5–5.2)
Sodium: 135 mmol/L (ref 134–144)
Total Protein: 7.3 g/dL (ref 6.0–8.5)

## 2020-02-22 LAB — LIPID PANEL
Chol/HDL Ratio: 2.8 ratio (ref 0.0–4.4)
Cholesterol, Total: 269 mg/dL — ABNORMAL HIGH (ref 100–199)
HDL: 95 mg/dL (ref >39)
LDL Chol Calc (NIH): 155 mg/dL — ABNORMAL HIGH (ref 0–99)
Triglycerides: 112 mg/dL (ref 0–149)
VLDL Cholesterol Cal: 19 mg/dL (ref 5–40)

## 2020-02-22 LAB — CBC
Hematocrit: 43.8 % (ref 34.0–46.6)
Hemoglobin: 15.5 g/dL (ref 11.1–15.9)
MCH: 31.9 pg (ref 26.6–33.0)
MCHC: 35.4 g/dL (ref 31.5–35.7)
MCV: 90 fL (ref 79–97)
Platelets: 249 10*3/uL (ref 150–450)
RBC: 4.86 x10E6/uL (ref 3.77–5.28)
RDW: 12.5 % (ref 11.7–15.4)
WBC: 6.9 10*3/uL (ref 3.4–10.8)

## 2020-02-22 MED ORDER — AMLODIPINE BESYLATE 5 MG PO TABS: 5.0000 mg | ORAL_TABLET | Freq: Every day | ORAL | 3 refills | Status: DC

## 2020-02-22 MED ORDER — RAMIPRIL 10 MG PO CAPS: ORAL_CAPSULE | ORAL | 3 refills | Status: DC

## 2020-02-22 NOTE — Patient Instructions (Signed)
Medication Instructions:  Your physician recommends that you continue on your current medications as directed. Please refer to the Current Medication list given to you today.  *If you need a refill on your cardiac medications before your next appointment, please call your pharmacy*   Lab Work: TODAY: CBC, CMET, LIPIDS  If you have labs (blood work) drawn today and your tests are completely normal, you will receive your results only by: Marland Kitchen MyChart Message (if you have MyChart) OR . A paper copy in the mail If you have any lab test that is abnormal or we need to change your treatment, we will call you to review the results.   Testing/Procedures: None ordered   Follow-Up: At [facility], you and your health needs are our priority.  As part of our continuing mission to provide you with exceptional heart care, we have created designated Provider Care Teams.  These Care Teams include your primary Cardiologist (physician) and Advanced Practice Providers (APPs -  Physician Assistants and Nurse Practitioners) who all work together to provide you with the care you need, when you need it.  We recommend signing up for the patient portal called "MyChart".  Sign up information is provided on this After Visit Summary.  MyChart is used to connect with patients for Virtual Visits (Telemedicine).  Patients are able to view lab/test results, encounter notes, upcoming appointments, etc.  Non-urgent messages can be sent to your provider as well.   To learn more about what you can do with MyChart, go to [email or web address].    Your next appointment:   12 month(s)  The format for your next appointment:   In Person  Provider:   You may see Lance Muss, MD or one of the following Advanced Practice Providers on your designated Care Team:    Ronie Spies, PA-C  Jacolyn Reedy, PA-C    Other Instructions

## 2020-09-19 ENCOUNTER — Other Ambulatory Visit: Payer: Self-pay | Admitting: *Deleted

## 2020-09-19 MED ORDER — RAMIPRIL 10 MG PO CAPS: ORAL_CAPSULE | ORAL | 1 refills | Status: DC

## 2020-09-19 MED ORDER — AMLODIPINE BESYLATE 5 MG PO TABS: 5.0000 mg | ORAL_TABLET | Freq: Every day | ORAL | 1 refills | Status: DC

## 2020-11-17 DIAGNOSIS — Z6827 Body mass index (BMI) 27.0-27.9, adult: Secondary | ICD-10-CM | POA: Diagnosis not present

## 2020-11-17 DIAGNOSIS — Z01419 Encounter for gynecological examination (general) (routine) without abnormal findings: Secondary | ICD-10-CM | POA: Diagnosis not present

## 2020-11-17 DIAGNOSIS — Z1231 Encounter for screening mammogram for malignant neoplasm of breast: Secondary | ICD-10-CM | POA: Diagnosis not present

## 2020-12-07 ENCOUNTER — Telehealth: Payer: Self-pay | Admitting: Interventional Cardiology

## 2020-12-07 MED ORDER — AMLODIPINE BESYLATE 5 MG PO TABS: 5.0000 mg | ORAL_TABLET | Freq: Every day | ORAL | 0 refills | Status: DC

## 2020-12-07 MED ORDER — RAMIPRIL 10 MG PO CAPS: ORAL_CAPSULE | ORAL | 0 refills | Status: DC

## 2020-12-07 NOTE — Telephone Encounter (Signed)
Pt's medication was sent to pt's pharmacy as requested. Confirmation received.  °

## 2020-12-07 NOTE — Telephone Encounter (Signed)
*  STAT* If patient is at the pharmacy, call can be transferred to refill team.   1. Which medications need to be refilled? (please list name of each medication and dose if known) amLODipine (NORVASC) 5 MG tablet [[id number]] ramipril (ALTACE) 10 MG capsule [[id number]  2. Which pharmacy/location (including street and city if local pharmacy) is medication to be sent to? [facility] - [place], [place] - [address]  [address], [place] [address]  Phone:  [phone] Fax:  [phone]   3. Do they need a 30 day or 90 day supply? 90

## 2021-01-20 DIAGNOSIS — Z1231 Encounter for screening mammogram for malignant neoplasm of breast: Secondary | ICD-10-CM | POA: Diagnosis not present

## 2021-03-20 ENCOUNTER — Telehealth: Payer: Self-pay | Admitting: Interventional Cardiology

## 2021-03-20 MED ORDER — AMLODIPINE BESYLATE 5 MG PO TABS: 5.0000 mg | ORAL_TABLET | Freq: Every day | ORAL | 0 refills | Status: DC

## 2021-03-20 MED ORDER — RAMIPRIL 10 MG PO CAPS: ORAL_CAPSULE | ORAL | 0 refills | Status: DC

## 2021-03-20 NOTE — Telephone Encounter (Signed)
Pt's medication was sent to pt's pharmacy as requested. Confirmation received.  °

## 2021-03-20 NOTE — Telephone Encounter (Signed)
*  STAT* If patient is at the pharmacy, call can be transferred to refill team.   1. Which medications need to be refilled? (please list name of each medication and dose if known)? amLODipine (NORVASC) 5 MG tablet ramipril (ALTACE) 10 MG capsule  2. Which pharmacy/location (including street and city if local pharmacy) is medication to be sent to? [facility] - [place], [place] - [address]  3. Do they need a 30 day or 90 day supply? 90

## 2021-03-21 ENCOUNTER — Ambulatory Visit: Payer: 59 | Admitting: Interventional Cardiology

## 2021-05-24 NOTE — Progress Notes (Signed)
Cardiology Office Note   Date:  05/25/2021   ID:  [name], DOB Mar 29, 1961, MRN [id number]  PCP:  Pcp, No    No chief complaint on file.  HTN  Wt Readings from Last 3 Encounters:  05/25/21 153 lb (69.4 kg)  02/22/20 145 lb 1.9 oz (65.8 kg)  09/23/18 142 lb 12.8 oz (64.8 kg)       History of Present Illness: [name] is a 60 y.o. female  Who I have seen for many years for HTN.   Moved to [place] but came back to [place] for appts.  Denies : Chest pain. Dizziness. Leg edema. Nitroglycerin use. Orthopnea. Palpitations. Paroxysmal nocturnal dyspnea. Shortness of breath. Syncope.     Home readings range from 110-140/80s.  Exercise has decreased recently.    Past Medical History:  Diagnosis Date   Family history of breast cancer    mother, sister   Hypertension    Hyponatremia    on HCTZ    Past Surgical History:  Procedure Laterality Date   BREAST BIOPSY  06/29/11   left, atypical ductal hyperplasia with radial scar     Current Outpatient Medications  Medication Sig Dispense Refill   amLODipine (NORVASC) 5 MG tablet Take 1 tablet (5 mg total) by mouth daily. Please keep upcoming appt with Dr. Ellouise Newer 2022 before anymore refills. Thank you 90 tablet 0   ramipril (ALTACE) 10 MG capsule TAKE 1 CAPSULE BY MOUTH  TWICE DAILY. Please keep upcoming appt with Dr. Eldridge Dace in July 2022 before anymore refills. Thank you 180 capsule 0   No current facility-administered medications for this visit.    Allergies:   Patient has no known allergies.    Social History:  The patient  reports that she has quit smoking. Her smoking use included cigarettes. She has never used smokeless tobacco. She reports current alcohol use of about 5.0 standard drinks of alcohol per week. She reports that she does not use drugs.   Family History:  The patient's family history includes Cancer in her maternal aunt, mother, and sister; Heart disease in her father.    ROS:   Please see the history of present illness.   Otherwise, review of systems are positive for weight gain.   All other systems are reviewed and negative.    PHYSICAL EXAM: VS:  BP 124/82   Pulse 75   Ht 5' (1.524 m)   Wt 153 lb (69.4 kg)   SpO2 95%   BMI 29.88 kg/m  , BMI Body mass index is 29.88 kg/m. GEN: Well nourished, well developed, in no acute distress HEENT: normal Neck: no JVD, carotid bruits, or masses Cardiac: RRR; no murmurs, rubs, or gallops,no edema  Respiratory:  clear to auscultation bilaterally, normal work of breathing GI: soft, nontender, nondistended, + BS MS: no deformity or atrophy Skin: warm and dry, no rash Neuro:  Strength and sensation are intact Psych: euthymic mood, full affect   EKG:   The ekg ordered today demonstrates NSR, no ST changes   Recent Labs: No results found for requested labs within last 8760 hours.   Lipid Panel    Component Value Date/Time   CHOL 269 (H) 02/22/2020 1126   TRIG 112 02/22/2020 1126   HDL 95 02/22/2020 1126   CHOLHDL 2.8 02/22/2020 1126   LDLCALC 155 (H) 02/22/2020 1126     Other studies Reviewed: Additional studies/ records that were reviewed today with results demonstrating:  LDL 155.   ASSESSMENT AND  PLAN:  HTN: Controlled at home.  Low salt diet.  Avoid processed foods.  No caffeine.  Moderate wine intake.  COntinue current dose.  Edema: Elevate legs for LE edema.  CBC, CMet, TSH, lipids.  Plan for labs and calcium scoring CT.  SHe will do both in [place].  Orders printed for her to take to the facilities in Portal.    Current medicines are reviewed at length with the patient today.  The patient concerns regarding her medicines were addressed.  The following changes have been made:  No change  Labs/ tests ordered today include: Coronary CT, labs as noted as well No orders of the defined types were placed in this encounter.   Recommend 150 minutes/week of aerobic exercise Low fat, low carb,  high fiber diet recommended  Disposition:   FU in 1 year   Signed, Lance Muss, MD  05/25/2021 1:48 PM    [facility] [address] [place], [place], [place]  [address] Phone: [phone]; Fax: [phone]

## 2021-05-25 ENCOUNTER — Ambulatory Visit (INDEPENDENT_AMBULATORY_CARE_PROVIDER_SITE_OTHER): Payer: BC Managed Care – PPO | Admitting: Interventional Cardiology

## 2021-05-25 ENCOUNTER — Other Ambulatory Visit: Payer: Self-pay

## 2021-05-25 ENCOUNTER — Other Ambulatory Visit: Payer: Self-pay | Admitting: *Deleted

## 2021-05-25 ENCOUNTER — Encounter: Payer: Self-pay | Admitting: Interventional Cardiology

## 2021-05-25 VITALS — BP 124/82 | HR 75 | Ht 60.0 in | Wt 153.0 lb

## 2021-05-25 DIAGNOSIS — R600 Localized edema: Secondary | ICD-10-CM

## 2021-05-25 DIAGNOSIS — E782 Mixed hyperlipidemia: Secondary | ICD-10-CM

## 2021-05-25 DIAGNOSIS — I10 Essential (primary) hypertension: Secondary | ICD-10-CM | POA: Diagnosis not present

## 2021-05-25 NOTE — Patient Instructions (Signed)
Medication Instructions:  Your physician recommends that you continue on your current medications as directed. Please refer to the Current Medication list given to you today.  *If you need a refill on your cardiac medications before your next appointment, please call your pharmacy*   Lab Work: Have fasting lab work done at lab near you.  CBC, CMET,lipids and TSH.  You have a prescription for this If you have labs (blood work) drawn today and your tests are completely normal, you will receive your results only by: MyChart Message (if you have MyChart) OR A paper copy in the mail If you have any lab test that is abnormal or we need to change your treatment, we will call you to review the results.   Testing/Procedures: Have Calcium Score CT done.  You have a prescription for this   Follow-Up: At [facility], you and your health needs are our priority.  As part of our continuing mission to provide you with exceptional heart care, we have created designated Provider Care Teams.  These Care Teams include your primary Cardiologist (physician) and Advanced Practice Providers (APPs -  Physician Assistants and Nurse Practitioners) who all work together to provide you with the care you need, when you need it.  We recommend signing up for the patient portal called "MyChart".  Sign up information is provided on this After Visit Summary.  MyChart is used to connect with patients for Virtual Visits (Telemedicine).  Patients are able to view lab/test results, encounter notes, upcoming appointments, etc.  Non-urgent messages can be sent to your provider as well.   To learn more about what you can do with MyChart, go to [email or web address].    Your next appointment:   12 month(s)  The format for your next appointment:   In Person  Provider:   You may see Lance Muss, MD or one of the following Advanced Practice Providers on your designated Care Team:   Ronie Spies, PA-C Jacolyn Reedy,  PA-C   Other Instructions

## 2021-06-12 ENCOUNTER — Other Ambulatory Visit: Payer: Self-pay | Admitting: Interventional Cardiology

## 2021-06-19 DIAGNOSIS — D1801 Hemangioma of skin and subcutaneous tissue: Secondary | ICD-10-CM | POA: Diagnosis not present

## 2021-12-12 DIAGNOSIS — R300 Dysuria: Secondary | ICD-10-CM | POA: Diagnosis not present

## 2021-12-12 DIAGNOSIS — N3001 Acute cystitis with hematuria: Secondary | ICD-10-CM | POA: Diagnosis not present

## 2021-12-22 ENCOUNTER — Other Ambulatory Visit: Payer: Self-pay | Admitting: *Deleted

## 2021-12-22 MED ORDER — AMLODIPINE BESYLATE 5 MG PO TABS: ORAL_TABLET | ORAL | 1 refills | Status: DC

## 2021-12-22 MED ORDER — RAMIPRIL 10 MG PO CAPS: ORAL_CAPSULE | ORAL | 1 refills | Status: DC

## 2022-01-03 DIAGNOSIS — B9689 Other specified bacterial agents as the cause of diseases classified elsewhere: Secondary | ICD-10-CM | POA: Diagnosis not present

## 2022-01-03 DIAGNOSIS — N898 Other specified noninflammatory disorders of vagina: Secondary | ICD-10-CM | POA: Diagnosis not present

## 2022-01-03 DIAGNOSIS — N760 Acute vaginitis: Secondary | ICD-10-CM | POA: Diagnosis not present

## 2022-01-03 DIAGNOSIS — R8290 Unspecified abnormal findings in urine: Secondary | ICD-10-CM | POA: Diagnosis not present

## 2022-01-29 DIAGNOSIS — Z1231 Encounter for screening mammogram for malignant neoplasm of breast: Secondary | ICD-10-CM | POA: Diagnosis not present

## 2022-04-11 DIAGNOSIS — I10 Essential (primary) hypertension: Secondary | ICD-10-CM | POA: Diagnosis not present

## 2022-04-11 DIAGNOSIS — E782 Mixed hyperlipidemia: Secondary | ICD-10-CM | POA: Diagnosis not present

## 2022-04-11 DIAGNOSIS — Z0000 Encounter for general adult medical examination without abnormal findings: Secondary | ICD-10-CM | POA: Diagnosis not present

## 2022-04-11 DIAGNOSIS — R7989 Other specified abnormal findings of blood chemistry: Secondary | ICD-10-CM | POA: Diagnosis not present

## 2022-05-22 ENCOUNTER — Other Ambulatory Visit: Payer: Self-pay | Admitting: Interventional Cardiology

## 2022-05-22 ENCOUNTER — Other Ambulatory Visit: Payer: Self-pay

## 2022-05-22 MED ORDER — RAMIPRIL 10 MG PO CAPS: ORAL_CAPSULE | ORAL | 0 refills | Status: DC

## 2022-08-21 DIAGNOSIS — R399 Unspecified symptoms and signs involving the genitourinary system: Secondary | ICD-10-CM | POA: Diagnosis not present

## 2022-08-23 ENCOUNTER — Ambulatory Visit: Payer: BC Managed Care – PPO | Admitting: Interventional Cardiology

## 2022-08-23 DIAGNOSIS — R399 Unspecified symptoms and signs involving the genitourinary system: Secondary | ICD-10-CM | POA: Diagnosis not present

## 2022-08-30 ENCOUNTER — Ambulatory Visit: Payer: BC Managed Care – PPO | Attending: Interventional Cardiology | Admitting: Physician Assistant

## 2022-08-30 ENCOUNTER — Encounter: Payer: Self-pay | Admitting: Physician Assistant

## 2022-08-30 VITALS — BP 132/82 | HR 87 | Ht 60.0 in | Wt 149.8 lb

## 2022-08-30 DIAGNOSIS — R600 Localized edema: Secondary | ICD-10-CM

## 2022-08-30 DIAGNOSIS — I10 Essential (primary) hypertension: Secondary | ICD-10-CM

## 2022-08-30 DIAGNOSIS — E782 Mixed hyperlipidemia: Secondary | ICD-10-CM | POA: Diagnosis not present

## 2022-08-30 NOTE — Progress Notes (Signed)
Cardiology Office Note:    Date:  08/30/2022   ID:  [name], DOB Nov 04, 1961, MRN [id number]  PCP:  Pcp, No  [facility] Cardiologist:  Lance Muss, MD  [facility] Electrophysiologist:  None   Chief Complaint: yearly follow up   History of Present Illness:    [name] is a 61 y.o. female with a hx of hypertension and lipidemia with elevated LFTs seen for follow-up.    Moved to [place], [place].   Lab work 04/2022: LDL 138, HDL 105, total cholesterol 267, elevated AST and ALT Coronary artery calcium score 7.3   Patient is here for follow-up.  She denies chest pain, shortness of breath, orthopnea, PND, syncope, lower extremity edema or melena.  Intermittently eats high salt diet.   Past Medical History:  Diagnosis Date   Family history of breast cancer    mother, sister   Hypertension    Hyponatremia    on HCTZ    Past Surgical History:  Procedure Laterality Date   BREAST BIOPSY  06/29/11   left, atypical ductal hyperplasia with radial scar    Current Medications: Current Meds  Medication Sig   amLODipine (NORVASC) 5 MG tablet Take 1 tablet by mouth daily.   ramipril (ALTACE) 10 MG capsule TAKE 1 CAPSULE TWICE DAILY     Allergies:   Patient has no known allergies.   Social History   Socioeconomic History   Marital status: Married    Spouse name: Not on file   Number of children: Not on file   Years of education: Not on file   Highest education level: Not on file  Occupational History   Not on file  Tobacco Use   Smoking status: Former    Types: Cigarettes   Smokeless tobacco: Never   Tobacco comments:    quit 4 years ago  Vaping Use   Vaping Use: Never used  Substance and Sexual Activity   Alcohol use: Yes    Alcohol/week: 5.0 standard drinks of alcohol    Types: 5 Glasses of wine per week    Comment: Weekly   Drug use: No   Sexual activity: Not on file  Other Topics Concern   Not on file  Social History Narrative   Not  on file   Social Determinants of Health   Financial Resource Strain: Not on file  Food Insecurity: Not on file  Transportation Needs: Not on file  Physical Activity: Not on file  Stress: Not on file  Social Connections: Not on file     Family History: The patient's family history includes Cancer in her maternal aunt, mother, and sister; Heart disease in her father.    ROS:   Please see the history of present illness.    All other systems reviewed and are negative.   EKGs/Labs/Other Studies Reviewed:    The following studies were reviewed today: CT calcium scoring 04/2022 FINDINGS:   Left Main Artery Score: 0  Left Anterior Descending Artery Score: 0.991  Left Circumflex Artery Score: 0  Right Coronary Artery Score: 6.34   The coronary artery calcium Agatston Score is 7.33.  The coronary  artery calcium volume is 13.2 mm3.  This places the patient in  the 52 percentile (51 percent of patients having a lower score  for age/sex matched controls).    EKG:  EKG is  ordered today.  The ekg ordered today demonstrates NSR  Recent Labs: No results found for requested labs within last 365 days.  Recent Lipid Panel    Component Value Date/Time   CHOL 269 (H) 02/22/2020 1126   TRIG 112 02/22/2020 1126   HDL 95 02/22/2020 1126   CHOLHDL 2.8 02/22/2020 1126   LDLCALC 155 (H) 02/22/2020 1126    Physical Exam:    VS:  BP 132/82   Pulse 87   Ht 5' (1.524 m)   Wt 149 lb 12.8 oz (67.9 kg)   SpO2 98%   BMI 29.26 kg/m     Wt Readings from Last 3 Encounters:  08/30/22 149 lb 12.8 oz (67.9 kg)  05/25/21 153 lb (69.4 kg)  02/22/20 145 lb 1.9 oz (65.8 kg)     GEN:  Well nourished, well developed in no acute distress HEENT: Normal NECK: No JVD; No carotid bruits LYMPHATICS: No lymphadenopathy CARDIAC: RRR, no murmurs, rubs, gallops RESPIRATORY:  Clear to auscultation without rales, wheezing or rhonchi  ABDOMEN: Soft, non-tender, non-distended MUSCULOSKELETAL:  No edema;  No deformity  SKIN: Warm and dry NEUROLOGIC:  Alert and oriented x 3 PSYCHIATRIC:  Normal affect   ASSESSMENT AND PLAN:    Hypertension Blood pressure stable and controlled on current medications.  No change.  2.  Hyperlipidemia with elevated LFTs - Lab work 04/2022: LDL 138, HDL 105, total cholesterol 267, elevated AST and ALT - Coronary artery calcium score 7.3 -Being followed by PCP at [facility] - Not on statin   3.  Intermittent lower extremity edema Likely diet related.  Discussed to cut back on salt & keep diary.   Medication Adjustments/Labs and Tests Ordered: Current medicines are reviewed at length with the patient today.  Concerns regarding medicines are outlined above.  No orders of the defined types were placed in this encounter.  No orders of the defined types were placed in this encounter.   Patient Instructions  Medication Instructions:  Your physician recommends that you continue on your current medications as directed. Please refer to the Current Medication list given to you today. *If you need a refill on your cardiac medications before your next appointment, please call your pharmacy*   Lab Work: None Ordered   Testing/Procedures: None ordered    Follow-Up: At [facility], you and your health needs are our priority.  As part of our continuing mission to provide you with exceptional heart care, we have created designated Provider Care Teams.  These Care Teams include your primary Cardiologist (physician) and Advanced Practice Providers (APPs -  Physician Assistants and Nurse Practitioners) who all work together to provide you with the care you need, when you need it.  We recommend signing up for the patient portal called "MyChart".  Sign up information is provided on this After Visit Summary.  MyChart is used to connect with patients for Virtual Visits (Telemedicine).  Patients are able to view lab/test results, encounter notes, upcoming  appointments, etc.  Non-urgent messages can be sent to your provider as well.   To learn more about what you can do with MyChart, go to [email or web address].    Your next appointment:   12 month(s)  The format for your next appointment:   In Person  Provider:   Larae Grooms, MD     Other Instructions   Important Information About Sugar         Signed, Leanor Kail, [place]  08/30/2022 1:46 PM     [facility]

## 2022-08-30 NOTE — Patient Instructions (Signed)
Medication Instructions:  Your physician recommends that you continue on your current medications as directed. Please refer to the Current Medication list given to you today. *If you need a refill on your cardiac medications before your next appointment, please call your pharmacy*   Lab Work: None Ordered   Testing/Procedures: None ordered    Follow-Up: At [facility], you and your health needs are our priority.  As part of our continuing mission to provide you with exceptional heart care, we have created designated Provider Care Teams.  These Care Teams include your primary Cardiologist (physician) and Advanced Practice Providers (APPs -  Physician Assistants and Nurse Practitioners) who all work together to provide you with the care you need, when you need it.  We recommend signing up for the patient portal called "MyChart".  Sign up information is provided on this After Visit Summary.  MyChart is used to connect with patients for Virtual Visits (Telemedicine).  Patients are able to view lab/test results, encounter notes, upcoming appointments, etc.  Non-urgent messages can be sent to your provider as well.   To learn more about what you can do with MyChart, go to [email or web address].    Your next appointment:   12 month(s)  The format for your next appointment:   In Person  Provider:   Larae Grooms, MD     Other Instructions   Important Information About Sugar

## 2022-08-31 NOTE — Addendum Note (Signed)
Addended by: Drue Novel I on: 08/31/2022 05:00 PM   Modules accepted: Orders

## 2022-09-03 NOTE — Addendum Note (Signed)
Addended by: Danielle Dess on: 09/03/2022 10:11 AM   Modules accepted: Orders

## 2022-10-11 ENCOUNTER — Telehealth: Payer: Self-pay | Admitting: Interventional Cardiology

## 2022-10-11 MED ORDER — AMLODIPINE BESYLATE 5 MG PO TABS
ORAL_TABLET | ORAL | 1 refills | Status: DC
Start: 2022-10-11 — End: 2022-11-06

## 2022-10-11 MED ORDER — RAMIPRIL 10 MG PO CAPS: 10.0000 mg | ORAL_CAPSULE | Freq: Two times a day (BID) | ORAL | 1 refills | Status: DC

## 2022-10-11 NOTE — Addendum Note (Signed)
Addended by: Shelby Dubin on: 10/11/2022 04:41 PM   Modules accepted: Orders

## 2022-10-11 NOTE — Telephone Encounter (Signed)
*  STAT* If patient is at the pharmacy, call can be transferred to refill team.   1. Which medications need to be refilled? (please list name of each medication and dose if known)   ramipril (ALTACE) 10 MG capsule   amLODipine (NORVASC) 5 MG tablet     2. Which pharmacy/location (including street and city if [facility]) is medication to be sent to [facility],  [address] [place], [place] [address]  3. Do they need a 30 day or 90 day supply? 90 day

## 2022-11-03 ENCOUNTER — Other Ambulatory Visit: Payer: Self-pay | Admitting: Interventional Cardiology

## 2023-04-19 ENCOUNTER — Other Ambulatory Visit: Payer: Self-pay | Admitting: Interventional Cardiology

## 2023-11-26 ENCOUNTER — Other Ambulatory Visit: Payer: Self-pay | Admitting: Interventional Cardiology

## 2023-12-25 ENCOUNTER — Other Ambulatory Visit: Payer: Self-pay | Admitting: Interventional Cardiology

## 2023-12-25 ENCOUNTER — Other Ambulatory Visit: Payer: Self-pay | Admitting: Physician Assistant
